# Patient Record
Sex: Male | Born: 1975 | Race: White | Hispanic: No | Marital: Married | Smoking: Current some day smoker
Health system: Southern US, Community
[De-identification: ages and names within clinical notes are randomized; demographics above are authoritative.]

## PROBLEM LIST (undated history)

## (undated) DIAGNOSIS — E739 Lactose intolerance, unspecified: Secondary | ICD-10-CM

## (undated) DIAGNOSIS — K635 Polyp of colon: Secondary | ICD-10-CM

## (undated) HISTORY — DX: Lactose intolerance, unspecified: E73.9

## (undated) HISTORY — DX: Polyp of colon: K63.5

## (undated) HISTORY — PX: THYROIDECTOMY: SHX17

## (undated) HISTORY — PX: ANTERIOR CRUCIATE LIGAMENT REPAIR: SHX115

## (undated) HISTORY — PX: COLONOSCOPY: SHX174

---

## 2011-05-30 HISTORY — PX: OTHER SURGICAL HISTORY: SHX169

## 2011-05-30 HISTORY — PX: ORIF FOREARM FRACTURE: SHX2124

## 2013-04-01 ENCOUNTER — Emergency Department (HOSPITAL_BASED_OUTPATIENT_CLINIC_OR_DEPARTMENT_OTHER)
Admission: EM | Admit: 2013-04-01 | Discharge: 2013-04-01 | Disposition: A | Discharge status: Home / Self Care | Payer: BC Managed Care – PPO | Attending: Emergency Medicine | Admitting: Emergency Medicine

## 2013-04-01 ENCOUNTER — Emergency Department (HOSPITAL_BASED_OUTPATIENT_CLINIC_OR_DEPARTMENT_OTHER): Payer: BC Managed Care – PPO

## 2013-04-01 ENCOUNTER — Encounter (HOSPITAL_BASED_OUTPATIENT_CLINIC_OR_DEPARTMENT_OTHER): Payer: Self-pay | Admitting: Emergency Medicine

## 2013-04-01 DIAGNOSIS — S93409A Sprain of unspecified ligament of unspecified ankle, initial encounter: Secondary | ICD-10-CM | POA: Insufficient documentation

## 2013-04-01 DIAGNOSIS — S63259A Unspecified dislocation of unspecified finger, initial encounter: Secondary | ICD-10-CM | POA: Insufficient documentation

## 2013-04-01 DIAGNOSIS — S93402A Sprain of unspecified ligament of left ankle, initial encounter: Secondary | ICD-10-CM

## 2013-04-01 DIAGNOSIS — Y92410 Unspecified street and highway as the place of occurrence of the external cause: Secondary | ICD-10-CM | POA: Insufficient documentation

## 2013-04-01 DIAGNOSIS — IMO0002 Reserved for concepts with insufficient information to code with codable children: Secondary | ICD-10-CM | POA: Insufficient documentation

## 2013-04-01 DIAGNOSIS — Y9389 Activity, other specified: Secondary | ICD-10-CM | POA: Insufficient documentation

## 2013-04-01 DIAGNOSIS — I10 Essential (primary) hypertension: Secondary | ICD-10-CM | POA: Insufficient documentation

## 2013-04-01 DIAGNOSIS — S80212A Abrasion, left knee, initial encounter: Secondary | ICD-10-CM

## 2013-04-01 MED ORDER — HYDROCODONE-ACETAMINOPHEN 5-325 MG PO TABS
ORAL_TABLET | ORAL | Status: AC
  Filled 2013-04-01: qty 2

## 2013-04-01 MED ORDER — HYDROCODONE-ACETAMINOPHEN 5-325 MG PO TABS: 2.0000 | ORAL_TABLET | ORAL | Status: DC | PRN

## 2013-04-01 MED ORDER — HYDROCODONE-ACETAMINOPHEN 5-325 MG PO TABS
2.0000 | ORAL_TABLET | Freq: Once | ORAL | Status: AC
  Administered 2013-04-01: 2 via ORAL

## 2013-04-01 NOTE — ED Notes (Signed)
MD at bedside. 
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
## 2013-04-04 ENCOUNTER — Ambulatory Visit (INDEPENDENT_AMBULATORY_CARE_PROVIDER_SITE_OTHER): Payer: BC Managed Care – PPO | Admitting: Family Medicine

## 2013-04-04 ENCOUNTER — Encounter: Payer: Self-pay | Admitting: Family Medicine

## 2013-04-04 VITALS — BP 124/78 | HR 78 | Ht 70.0 in | Wt 200.0 lb

## 2013-04-04 DIAGNOSIS — S63279A Dislocation of unspecified interphalangeal joint of unspecified finger, initial encounter: Secondary | ICD-10-CM

## 2013-04-04 DIAGNOSIS — S8990XA Unspecified injury of unspecified lower leg, initial encounter: Secondary | ICD-10-CM

## 2013-04-04 DIAGNOSIS — S63289A Dislocation of proximal interphalangeal joint of unspecified finger, initial encounter: Secondary | ICD-10-CM

## 2013-04-04 DIAGNOSIS — S99912A Unspecified injury of left ankle, initial encounter: Secondary | ICD-10-CM

## 2013-04-04 NOTE — Patient Instructions (Signed)
You have a Grade 3 ankle sprain. Ice the area for 15 minutes at a time, 3-4 times a day Aleve 2 tabs twice a day with food OR ibuprofen 3 tabs three times a day with food for pain and inflammation. Elevate above the level of your heart when possible Crutches if needed to help with walking Bear weight when tolerated Use cam walker or laceup ankle brace to help with stability while you recover from this injury. Come out of the boot/brace twice a day to do Up/down and alphabet exercises 2-3 sets of each. Consider physical therapy for strengthening and balance exercises in future. If not improving as expected, we may repeat x-rays or consider further testing like an MRI. Follow up with me in 2 weeks to reevaluate.  Buddy tape fingers for support for 2-4 more weeks (whenever pain has completely resolved).

## 2013-04-07 ENCOUNTER — Encounter: Payer: Self-pay | Admitting: Family Medicine

## 2013-04-07 DIAGNOSIS — S63289A Dislocation of proximal interphalangeal joint of unspecified finger, initial encounter: Secondary | ICD-10-CM | POA: Insufficient documentation

## 2013-04-07 DIAGNOSIS — S99912A Unspecified injury of left ankle, initial encounter: Secondary | ICD-10-CM | POA: Insufficient documentation
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
## 2013-04-07 NOTE — Assessment & Plan Note (Signed)
radiographs negative.  Grade 3 clinically.  ASO or cam walker for support. Crutches until he can weight bear comfortably.  Start ROM exercises.  Elevation, icing, nsaids.  F/u in 2 weeks to reevaluate.  Consider repeating radiographs, further imaging if not improving.  Consider adding formal PT if he is improving as expected.

## 2013-04-07 NOTE — Assessment & Plan Note (Signed)
Right 5th PIP dislocation - dorsal.  Reduced with excellent motion, no evidence of fracture or central slip disruption.  Buddy taping for 2-4 weeks for support until pain resolves.

## 2013-04-17 ENCOUNTER — Encounter: Payer: Self-pay | Admitting: Family Medicine

## 2013-04-17 ENCOUNTER — Ambulatory Visit (INDEPENDENT_AMBULATORY_CARE_PROVIDER_SITE_OTHER): Payer: BC Managed Care – PPO | Admitting: Family Medicine

## 2013-04-17 VITALS — BP 121/80 | HR 80 | Ht 70.0 in | Wt 200.0 lb

## 2013-04-17 DIAGNOSIS — S93409A Sprain of unspecified ligament of unspecified ankle, initial encounter: Secondary | ICD-10-CM

## 2013-04-17 DIAGNOSIS — Z5189 Encounter for other specified aftercare: Secondary | ICD-10-CM

## 2013-04-17 DIAGNOSIS — S93402A Sprain of unspecified ligament of left ankle, initial encounter: Secondary | ICD-10-CM

## 2013-04-17 DIAGNOSIS — S99912D Unspecified injury of left ankle, subsequent encounter: Secondary | ICD-10-CM

## 2013-04-17 DIAGNOSIS — S63289D Dislocation of proximal interphalangeal joint of unspecified finger, subsequent encounter: Secondary | ICD-10-CM

## 2013-04-17 NOTE — Patient Instructions (Signed)
You have a Grade 3 ankle sprain. Ice the area for 15 minutes at a time, 3-4 times a day until pain resolved. Aleve 2 tabs twice a day with food OR ibuprofen 3 tabs three times a day with food for pain and inflammation as needed at this point. Elevate above the level of your heart when possible Laceup ankle brace to help with stability while you recover from this injury for the next 4 weeks. Continue up/down and alphabet exercises. Start physical therapy for strengthening and balance exercises in future. Follow up with me in 4 weeks to reevaluate.  Buddy tape for maximum of 2 more weeks.

## 2013-04-22 ENCOUNTER — Encounter: Payer: Self-pay | Admitting: Family Medicine
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
## 2013-04-22 NOTE — Assessment & Plan Note (Signed)
Right 5th PIP dislocation - dorsal.  Tendons intact.  Buddy tape for up to 2 more weeks then focus on motion.

## 2013-04-22 NOTE — Assessment & Plan Note (Signed)
Left ankle sprain - radiographs negative.  Grade 3 clinically.  Motion has improved along with pain mildly.  Start physical therapy.  Continue home exercises.  Wear ASO if tolerated as much as possible.  NSAIDs, icing, elevation.F/u in 4 weeks.

## 2013-04-29 ENCOUNTER — Ambulatory Visit: Payer: BC Managed Care – PPO | Attending: Family Medicine | Admitting: Physical Therapy

## 2013-04-29 DIAGNOSIS — IMO0001 Reserved for inherently not codable concepts without codable children: Secondary | ICD-10-CM | POA: Insufficient documentation

## 2013-04-29 DIAGNOSIS — M25579 Pain in unspecified ankle and joints of unspecified foot: Secondary | ICD-10-CM | POA: Insufficient documentation

## 2013-04-29 DIAGNOSIS — R609 Edema, unspecified: Secondary | ICD-10-CM | POA: Insufficient documentation

## 2013-05-07 ENCOUNTER — Encounter: Payer: BC Managed Care – PPO | Admitting: Physical Therapy

## 2013-05-13 ENCOUNTER — Ambulatory Visit: Payer: BC Managed Care – PPO | Admitting: Physical Therapy

## 2013-05-15 ENCOUNTER — Ambulatory Visit (INDEPENDENT_AMBULATORY_CARE_PROVIDER_SITE_OTHER): Payer: BC Managed Care – PPO | Admitting: Family Medicine

## 2013-05-15 ENCOUNTER — Encounter: Payer: Self-pay | Admitting: Family Medicine

## 2013-05-15 ENCOUNTER — Encounter (INDEPENDENT_AMBULATORY_CARE_PROVIDER_SITE_OTHER): Payer: Self-pay

## 2013-05-15 VITALS — BP 122/79 | HR 65 | Ht 70.0 in | Wt 200.0 lb

## 2013-05-15 DIAGNOSIS — S99912D Unspecified injury of left ankle, subsequent encounter: Secondary | ICD-10-CM

## 2013-05-15 DIAGNOSIS — Z5189 Encounter for other specified aftercare: Secondary | ICD-10-CM

## 2013-05-15 DIAGNOSIS — M25511 Pain in right shoulder: Secondary | ICD-10-CM

## 2013-05-15 DIAGNOSIS — M25519 Pain in unspecified shoulder: Secondary | ICD-10-CM

## 2013-05-15 MED ORDER — TRAMADOL HCL 50 MG PO TABS: 50.0000 mg | ORAL_TABLET | Freq: Three times a day (TID) | ORAL | Status: DC | PRN

## 2013-05-15 NOTE — Patient Instructions (Signed)
Continue physical therapy and transition to a home exercise program. Follow up with me in 6 weeks or as needed.

## 2013-05-18 ENCOUNTER — Encounter: Payer: Self-pay | Admitting: Family Medicine

## 2013-05-18 NOTE — Assessment & Plan Note (Signed)
Left ankle sprain - radiographs negative.  Improving.  Continue PT and transition to HEP.  ASO with long walking, irregular surfaces.  F/u in 6 weeks or prn.
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
## 2013-05-19 NOTE — Addendum Note (Signed)
Addended by: Lenda Kelp on: 05/19/2013 08:36 AM   Modules accepted: Orders, Medications

## 2013-11-21 ENCOUNTER — Telehealth: Payer: Self-pay | Admitting: *Deleted

## 2013-11-21 DIAGNOSIS — Z0000 Encounter for general adult medical examination without abnormal findings: Secondary | ICD-10-CM

## 2013-11-21 NOTE — Telephone Encounter (Signed)
Medication List and allergies: Reviewed and updated  90 Day supply/mail order: N/A Local prescriptions:  [facility]  Immunizations Due: PNA  A/P FH, PSH, or Personal Hx: Reviewed and updated  Tdap: Within past 10 years PNA: Never  Shingles: Never   To Discuss with Provider: Not at this time

## 2013-11-24 ENCOUNTER — Encounter: Payer: Self-pay | Admitting: Internal Medicine

## 2013-11-24 ENCOUNTER — Ambulatory Visit (INDEPENDENT_AMBULATORY_CARE_PROVIDER_SITE_OTHER): Payer: BC Managed Care – PPO | Admitting: Internal Medicine

## 2013-11-24 VITALS — BP 126/76 | HR 60 | Temp 98.0°F | Ht 69.1 in | Wt 212.0 lb

## 2013-11-24 DIAGNOSIS — Z0000 Encounter for general adult medical examination without abnormal findings: Secondary | ICD-10-CM

## 2013-11-24 NOTE — Progress Notes (Signed)
Pre visit review using our clinic review tool, if applicable. No additional management support is needed unless otherwise documented below in the visit note. 

## 2013-11-24 NOTE — Assessment & Plan Note (Signed)
Td ~ 2010  Never had a cscope  Diet-exercise-STE discussed  Labs

## 2013-11-24 NOTE — Progress Notes (Signed)
   Subjective:    Patient ID: Martin Pruitt, male    DOB: December 06, 1975, 38 y.o.   MRN: [id number]  DOS:  11/24/2013 Type of  Visit: CPX   ROS Diet-- healthy most of the time  Exercise-- active, yardwork No  CP, SOB No palpitations  Denies  nausea, vomiting diarrhea, blood in the stools (-) cough, sputum production (-) wheezing, chest congestion  No dysuria, gross hematuria, difficulty urinating  No anxiety, depression    No past medical history on file.  Past Surgical History  Procedure Laterality Date  . Thyroidectomy      partial  . Anterior cruciate ligament repair      bilaterally   . Arm fracture  2013    plate (LEFT)    History   Social History  . Marital Status: Married    Spouse Name: N/A    Number of Children: 3  . Years of Education: N/A   Occupational History  . 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 Smoking status: Never Smoker   . Smokeless tobacco: Never Used     Comment: occ cigarret  . Alcohol Use: Yes     Comment: rare   . Drug Use: No  . Sexual Activity: Not on file   Other Topics Concern  . Not on file   Social History Narrative   Remarried, 2 kids from second wife     Family History  Problem Relation Age of Onset  . Hypertension Mother   . Sudden death Neg Hx   . Hyperlipidemia Neg Hx   . Heart attack Neg Hx   . Diabetes Neg Hx   . Colon cancer Neg Hx   . Prostate cancer Neg Hx        Medication List       This list is accurate as of: 11/24/13 11:59 PM.  Always use your most recent med list.               multivitamin tablet  Take 1 tablet by mouth daily.           Objective:   Physical Exam BP 126/76  Pulse 60  Temp(Src) 98 F (36.7 C)  Ht 5' 9.1" (1.755 m)  Wt 212 lb (96.163 kg)  BMI 31.22 kg/m2  SpO2 99% General -- alert, well-developed, NAD.  Neck --no thyromegaly  HEENT-- Not pale. Lungs -- normal respiratory effort, no intercostal retractions, no accessory muscle use, and normal breath  sounds.  Heart-- normal rate, regular rhythm, no murmur.  Abdomen-- Not distended, good bowel sounds,soft, non-tender.  Extremities-- no pretibial edema bilaterally  Neurologic--  alert & oriented X3. Speech normal, gait appropriate for age, strength symmetric and appropriate for age.  Psych-- Cognition and judgment appear intact. Cooperative with normal attention span and concentration. No anxious or depressed appearing.      Assessment & Plan:

## 2013-11-24 NOTE — Patient Instructions (Signed)
Please come back fasting: CMP CBC TSH FLP-- dx v70  Next visit one year, fasting  Testicular Self-Exam A self-examination of your testicles involves looking at and feeling your testicles for abnormal lumps or swelling. Several things can cause swelling, lumps, or pain in your testicles. Some of these causes are:  Injuries.  Inflammation.  Infection.  Accumulation of fluids around your testicle (hydrocele).  Twisted testicles (testicular torsion).  Testicular cancer. Self-examination of the testicles and groin areas may be advised if you are at risk for testicular cancer. Risks for testicular cancer include:  An undescended testicle (cryptorchidism).  A history of previous testicular cancer.  A family history of testicular cancer. The testicles are easiest to examine after warm baths or showers and are more difficult to examine when you are cold. This is because the muscles attached to the testicles retract and pull them up higher or into the abdomen. Follow these steps while you are standing:  Hold your penis away from your body.  Roll one testicle between your thumb and forefinger, feeling the entire testicle.  Roll the other testicle between your thumb and forefinger, feeling the entire testicle. Feel for lumps, swelling, or discomfort. A normal testicle is egg shaped and feels firm. It is smooth and not tender. The spermatic cord can be felt as a firm spaghetti-like cord at the back of your testicle. It is also important to examine the crease between the front of your leg and your abdomen. Feel for any bumps that are tender. These could be enlarged lymph nodes.  Document Released: 08/21/2000 Document Revised: 01/15/2013 Document Reviewed: 11/04/2012 [facility] Patient Information 2015 [place], [place]. This information is not intended to replace advice given to you by your health care Henlee Donovan. Make sure you discuss any questions you have with your health care Harika Laidlaw.

## 2013-11-26 ENCOUNTER — Other Ambulatory Visit (INDEPENDENT_AMBULATORY_CARE_PROVIDER_SITE_OTHER): Payer: BC Managed Care – PPO

## 2013-11-26 DIAGNOSIS — Z0000 Encounter for general adult medical examination without abnormal findings: Secondary | ICD-10-CM

## 2013-11-26 LAB — COMPREHENSIVE METABOLIC PANEL
ALK PHOS: 49 U/L (ref 39–117)
ALT: 21 U/L (ref 0–53)
AST: 22 U/L (ref 0–37)
Albumin: 4.2 g/dL (ref 3.5–5.2)
BILIRUBIN TOTAL: 0.4 mg/dL (ref 0.2–1.2)
BUN: 20 mg/dL (ref 6–23)
CO2: 26 mEq/L (ref 19–32)
Calcium: 9.3 mg/dL (ref 8.4–10.5)
Chloride: 107 mEq/L (ref 96–112)
Creatinine, Ser: 1.5 mg/dL (ref 0.4–1.5)
GFR: 56.51 mL/min — ABNORMAL LOW (ref >60.00)
Glucose, Bld: 87 mg/dL (ref 70–99)
POTASSIUM: 4.2 meq/L (ref 3.5–5.1)
Sodium: 140 mEq/L (ref 135–145)
TOTAL PROTEIN: 6.8 g/dL (ref 6.0–8.3)

## 2013-11-26 LAB — CBC
HEMATOCRIT: 43.9 % (ref 39.0–52.0)
HEMOGLOBIN: 14.8 g/dL (ref 13.0–17.0)
MCHC: 33.6 g/dL (ref 30.0–36.0)
MCV: 86.7 fl (ref 78.0–100.0)
Platelets: 190 10*3/uL (ref 150.0–400.0)
RBC: 5.06 Mil/uL (ref 4.22–5.81)
RDW: 13 % (ref 11.5–15.5)
WBC: 7.1 10*3/uL (ref 4.0–10.5)

## 2013-11-26 LAB — LIPID PANEL
CHOL/HDL RATIO: 4
Cholesterol: 144 mg/dL (ref 0–200)
HDL: 35.5 mg/dL — AB (ref >39.00)
LDL CALC: 57 mg/dL (ref 0–99)
NONHDL: 108.5
Triglycerides: 260 mg/dL — ABNORMAL HIGH (ref 0.0–149.0)
VLDL: 52 mg/dL — AB (ref 0.0–40.0)

## 2013-11-26 LAB — TSH: TSH: 2.32 u[IU]/mL (ref 0.35–4.50)

## 2013-11-26 NOTE — Addendum Note (Signed)
Addended by: Peggyann Shoals on: 11/26/2013 09:01 AM   Modules accepted: Orders

## 2014-10-13 IMAGING — CR DG HAND COMPLETE 3+V*R*
3 series · 3 of 3 positions shown · non-contrast
Comparison: None.

CLINICAL DATA: Pain post trauma

EXAM:
RIGHT HAND - COMPLETE 3+ VIEW

[x hand pa right]
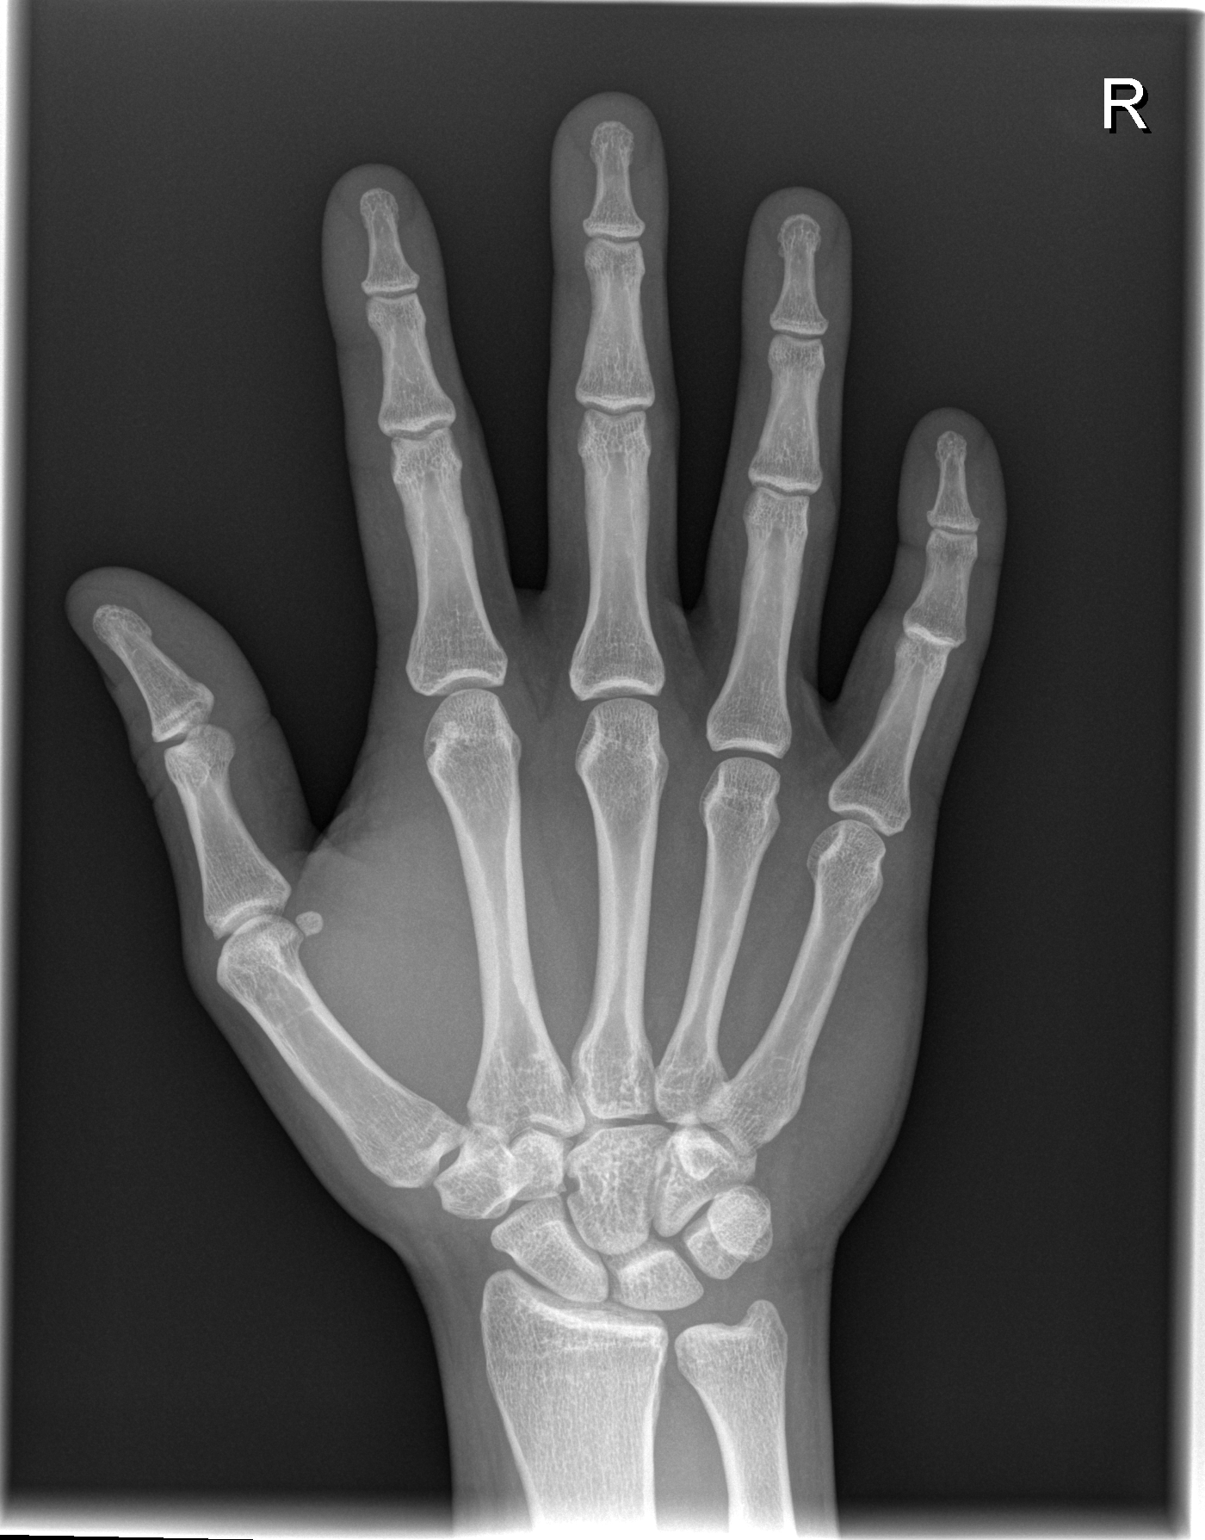

[x hand oblique right]
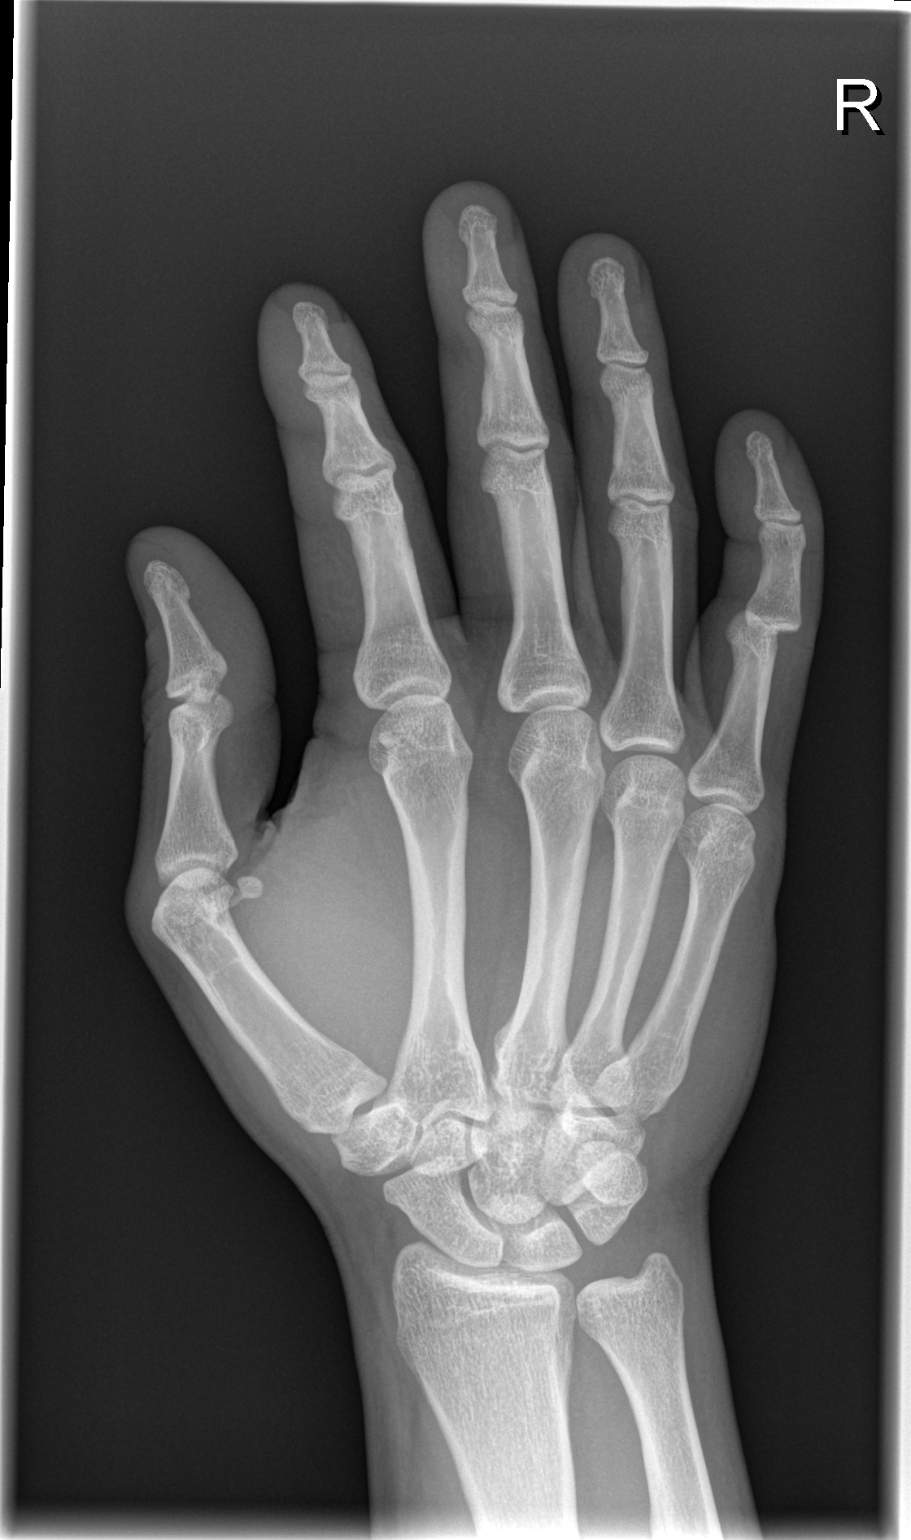

[x hand lat right]
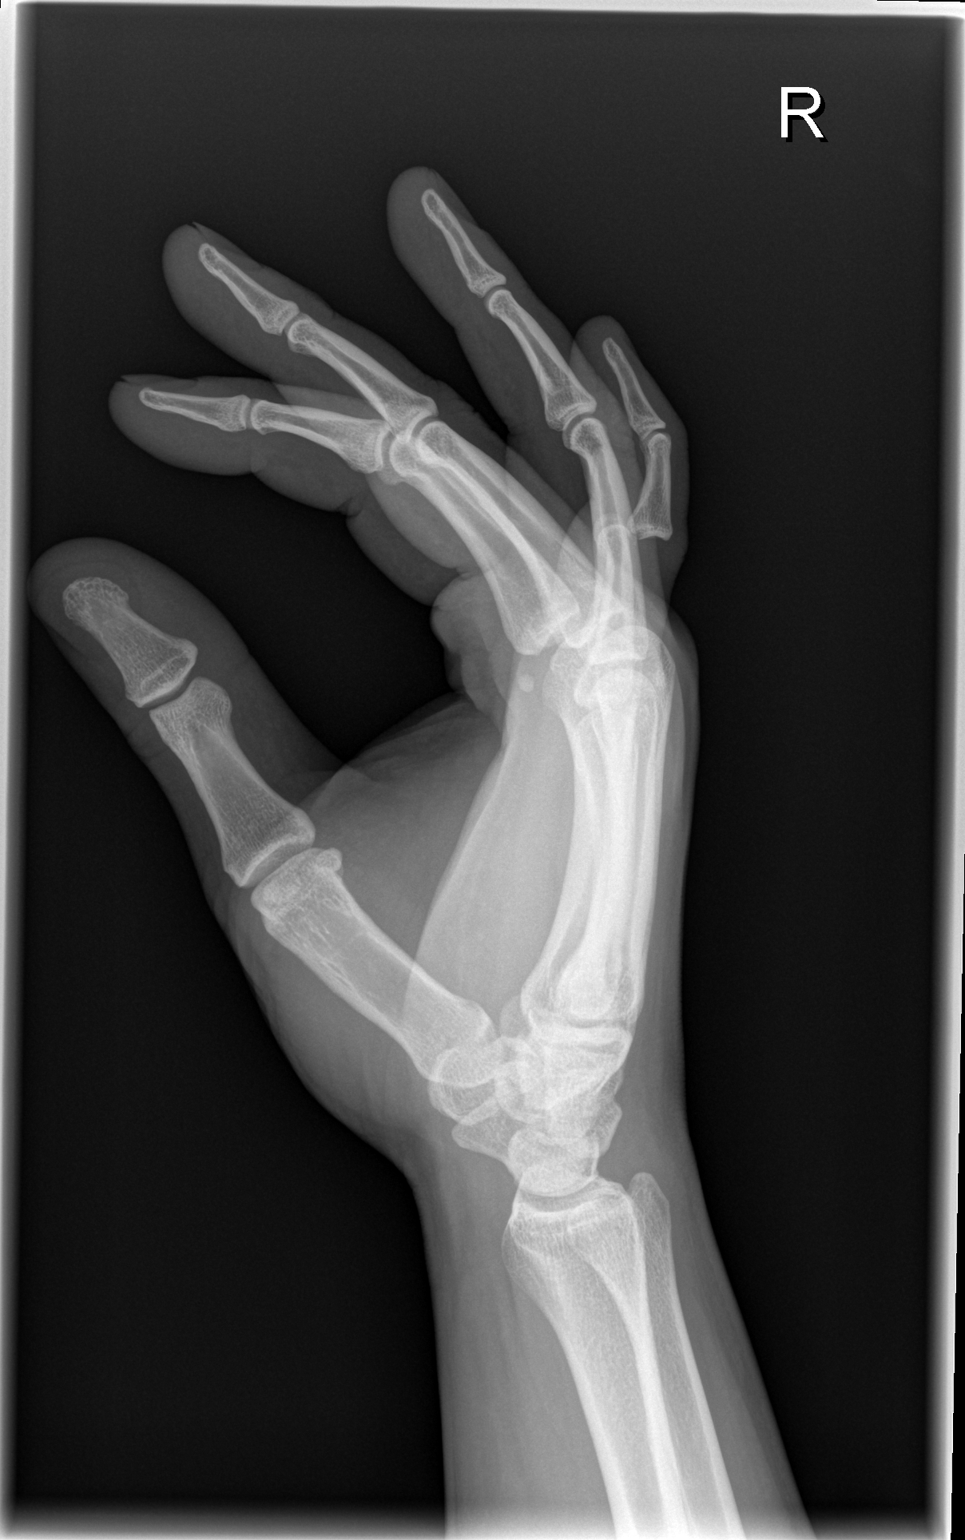

[3 of 3 positions shown; findings below may reference images not displayed]

FINDINGS: Frontal, oblique, and lateral views were obtained. There is
dislocation at the 5th PIP joint with the middle and distal
phalanges displaced dorsal to the 5th proximal phalanx. No fracture.
No other dislocations. No fractures elsewhere. Joint spaces appear
intact except for the area of dislocation at the 5th PIP joint.
IMPRESSION: Dislocation at the 5th PIP joint. No fracture. No other abnormality
noted.

## 2015-02-10 ENCOUNTER — Ambulatory Visit (HOSPITAL_BASED_OUTPATIENT_CLINIC_OR_DEPARTMENT_OTHER)
Admission: RE | Admit: 2015-02-10 | Discharge: 2015-02-10 | Disposition: A | Discharge status: Home / Self Care | Payer: BLUE CROSS/BLUE SHIELD | Source: Ambulatory Visit | Attending: Internal Medicine | Admitting: Internal Medicine

## 2015-02-10 ENCOUNTER — Ambulatory Visit (INDEPENDENT_AMBULATORY_CARE_PROVIDER_SITE_OTHER): Payer: BLUE CROSS/BLUE SHIELD | Admitting: Internal Medicine

## 2015-02-10 ENCOUNTER — Encounter: Payer: Self-pay | Admitting: Internal Medicine

## 2015-02-10 VITALS — BP 118/82 | HR 80 | Temp 98.2°F | Ht 69.0 in | Wt 208.1 lb

## 2015-02-10 DIAGNOSIS — Z0000 Encounter for general adult medical examination without abnormal findings: Secondary | ICD-10-CM | POA: Diagnosis not present

## 2015-02-10 DIAGNOSIS — Z23 Encounter for immunization: Secondary | ICD-10-CM | POA: Diagnosis not present

## 2015-02-10 DIAGNOSIS — S99912A Unspecified injury of left ankle, initial encounter: Secondary | ICD-10-CM

## 2015-02-10 DIAGNOSIS — S92132A Displaced fracture of posterior process of left talus, initial encounter for closed fracture: Secondary | ICD-10-CM | POA: Diagnosis not present

## 2015-02-10 DIAGNOSIS — M25572 Pain in left ankle and joints of left foot: Secondary | ICD-10-CM | POA: Diagnosis present

## 2015-02-10 DIAGNOSIS — Z114 Encounter for screening for human immunodeficiency virus [HIV]: Secondary | ICD-10-CM

## 2015-02-10 MED ORDER — HYDROCODONE-ACETAMINOPHEN 5-325 MG PO TABS: 1.0000 | ORAL_TABLET | Freq: Three times a day (TID) | ORAL | Status: DC | PRN

## 2015-02-10 NOTE — Progress Notes (Signed)
Pre visit review using our clinic review tool, if applicable. No additional management support is needed unless otherwise documented below in the visit note. 

## 2015-02-10 NOTE — Assessment & Plan Note (Addendum)
Td 01-2015, declined flu shot CCS: Never had a cscope  Prostate cancer screening: Not indicated Diet-exercise---- discussed  Labs   Other issues: Left knee pain, to see ortho this week Allergic to milk? Recommend avoidance for now.

## 2015-02-10 NOTE — Patient Instructions (Signed)
Get your blood work before you leave     Next visit  for a physical exam in one year Please schedule an appointment at the front desk Please come back fasting  
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
## 2015-02-11 ENCOUNTER — Telehealth: Payer: Self-pay | Admitting: Internal Medicine

## 2015-02-11 LAB — LIPID PANEL
Cholesterol: 149 mg/dL (ref 0–200)
HDL: 45.8 mg/dL (ref >39.00)
LDL Cholesterol: 71 mg/dL (ref 0–99)
NONHDL: 103.36
Total CHOL/HDL Ratio: 3
Triglycerides: 161 mg/dL — ABNORMAL HIGH (ref 0.0–149.0)
VLDL: 32.2 mg/dL (ref 0.0–40.0)

## 2015-02-11 LAB — BASIC METABOLIC PANEL
BUN: 17 mg/dL (ref 6–23)
CALCIUM: 9.5 mg/dL (ref 8.4–10.5)
CO2: 28 mEq/L (ref 19–32)
Chloride: 102 mEq/L (ref 96–112)
Creatinine, Ser: 1.33 mg/dL (ref 0.40–1.50)
GFR: 63.52 mL/min (ref >60.00)
GLUCOSE: 90 mg/dL (ref 70–99)
Potassium: 4.1 mEq/L (ref 3.5–5.1)
SODIUM: 140 meq/L (ref 135–145)

## 2015-02-11 LAB — HIV ANTIBODY (ROUTINE TESTING W REFLEX): HIV: NONREACTIVE

## 2015-02-11 LAB — TSH: TSH: 1.9 u[IU]/mL (ref 0.35–4.50)

## 2015-02-11 NOTE — Telephone Encounter (Signed)
Relation to BV:PLWU Call back number: [phone]   Reason for call:  Patient returning your call. Patient was last seen 02/10/15 and had a x ray done as well.

## 2015-02-11 NOTE — Telephone Encounter (Signed)
LMOM informing Pt of x-ray results. Informed him to call his orthopedic doctor and inform them that he needs a soft cast boot, informed him that his MD should be able to see x-rays in the computer. Instructed him to limit weight and movement until boot in received. Instructed him to call if he has any other questions or concerns.

## 2016-02-11 ENCOUNTER — Encounter: Payer: Self-pay | Admitting: Internal Medicine

## 2016-02-11 ENCOUNTER — Ambulatory Visit (INDEPENDENT_AMBULATORY_CARE_PROVIDER_SITE_OTHER): Payer: BLUE CROSS/BLUE SHIELD | Admitting: Internal Medicine

## 2016-02-11 DIAGNOSIS — Z0000 Encounter for general adult medical examination without abnormal findings: Secondary | ICD-10-CM | POA: Diagnosis not present

## 2016-02-11 LAB — LIPID PANEL
Cholesterol: 141 mg/dL (ref 125–200)
HDL: 43 mg/dL (ref >=40)
LDL CALC: 71 mg/dL (ref <130)
TRIGLYCERIDES: 136 mg/dL (ref <150)
Total CHOL/HDL Ratio: 3.3 Ratio (ref <=5.0)
VLDL: 27 mg/dL (ref <30)

## 2016-02-11 LAB — CBC WITH DIFFERENTIAL/PLATELET
BASOS ABS: 0 {cells}/uL (ref 0–200)
Basophils Relative: 0 %
EOS ABS: 148 {cells}/uL (ref 15–500)
Eosinophils Relative: 2 %
HEMATOCRIT: 44.6 % (ref 38.5–50.0)
HEMOGLOBIN: 15.2 g/dL (ref 13.2–17.1)
Lymphocytes Relative: 26 %
Lymphs Abs: 1924 cells/uL (ref 850–3900)
MCH: 29.3 pg (ref 27.0–33.0)
MCHC: 34.1 g/dL (ref 32.0–36.0)
MCV: 85.9 fL (ref 80.0–100.0)
MONOS PCT: 7 %
MPV: 10.7 fL (ref 7.5–12.5)
Monocytes Absolute: 518 cells/uL (ref 200–950)
Neutro Abs: 4810 cells/uL (ref 1500–7800)
Neutrophils Relative %: 65 %
PLATELETS: 188 10*3/uL (ref 140–400)
RBC: 5.19 MIL/uL (ref 4.20–5.80)
RDW: 13.4 % (ref 11.0–15.0)
WBC: 7.4 10*3/uL (ref 3.8–10.8)

## 2016-02-11 LAB — TSH: TSH: 1.66 mIU/L (ref 0.40–4.50)

## 2016-02-11 NOTE — Progress Notes (Signed)
Pre visit review using our clinic review tool, if applicable. No additional management support is needed unless otherwise documented below in the visit note. 

## 2016-02-11 NOTE — Assessment & Plan Note (Addendum)
Td 01-2015, declined flu shot CCS: Never had a cscope  Prostate cancer screening: Not indicated Diet-exercise----doing great, has lost weight. Labs reviewed, today will check a CBC, FLP and TSH. He is a very light smoker, encouraged to quit if possible, also to see the dentist regularly.

## 2016-02-11 NOTE — Patient Instructions (Signed)
GO TO THE LAB : Get the blood work     GO TO THE FRONT DESK Schedule your next appointment for a  Physical exam in 1 or 2 years

## 2016-02-11 NOTE — Progress Notes (Signed)
Subjective:    Patient ID: Martin Pruitt, male    DOB: [birth year], 40 y.o.   MRN: [id number]  DOS:  02/11/2016 Type of visit - description : CPX Interval history: No major concerns, has lost weight by  Increasing his  physical activity and trying to eat healthy  Wt Readings from Last 3 Encounters:  02/11/16 196 lb 2 oz (89 kg)  02/10/15 208 lb 2 oz (94.4 kg)  11/24/13 212 lb (96.2 kg)     Review of Systems  Constitutional: No fever. No chills. No unexplained wt changes. No unusual sweats  HEENT: No dental problems, no ear discharge, no facial swelling, no voice changes. No eye discharge, no eye  redness , no  intolerance to light   Respiratory: No wheezing , no  difficulty breathing. No cough , no mucus production  Cardiovascular: No CP, no leg swelling , no  Palpitations  GI: no nausea, no vomiting, no diarrhea , no  abdominal pain.  No blood in the stools. No dysphagia, no odynophagia    Endocrine: No polyphagia, no polyuria , no polydipsia  GU: No dysuria, gross hematuria, difficulty urinating. No urinary urgency, no frequency.  Musculoskeletal: Occasionally has aches and pains, he has a very active  Skin: No change in the color of the skin, palor , no  Rash  Allergic, immunologic: No environmental allergies , no  food allergies  Neurological: No dizziness no  syncope. No headaches. No diplopia, no slurred, no slurred speech, no motor deficits, no facial  Numbness  Hematological: No enlarged lymph nodes, no easy bruising , no unusual bleedings  Psychiatry: No suicidal ideas, no hallucinations, no beavior problems, no confusion.  No unusual/severe anxiety, no depression  History reviewed. No pertinent past medical history.  Past Surgical History:  Procedure Laterality Date  . ANTERIOR CRUCIATE LIGAMENT REPAIR     bilaterally   . arm fracture  2013   plate (LEFT)  . THYROIDECTOMY     partial    Social History   Social History  . Marital status: Married      Spouse name: N/A  . Number of children: 3  . 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 Smoking status: Light Tobacco Smoker  . Smokeless tobacco: Never Used     Comment: occ cigarret  . Alcohol use 0.0 oz/week     Comment: rare   . Drug use: No  . Sexual activity: Not on file   Other Topics Concern  . Not on file   Social History Narrative   Remarried, 2 kids with  second wife      Family History  Problem Relation Age of Onset  . Hypertension Mother   . Sudden death Neg Hx   . Hyperlipidemia Neg Hx   . Heart attack Neg Hx   . Diabetes Neg Hx   . Colon cancer Neg Hx   . Prostate cancer Neg Hx        Medication List       Accurate as of 02/11/16  3:31 PM. Always use your most recent med list.          FISH OIL PO Take 1 tablet by mouth daily.   HYDROcodone-acetaminophen 5-325 MG tablet Commonly known as:  NORCO/VICODIN Take 1-2 tablets by mouth every 8 (eight) hours as needed.   multivitamin tablet Take 1 tablet by mouth daily.   VITAMIN C PO Take  1 tablet by mouth daily.   Vitamin-B Complex Tabs Take 1 tablet by mouth daily.          Objective:   Physical Exam BP 108/78 (BP Location: Left Arm, Patient Position: Sitting, Cuff Size: Normal)   Pulse 79   Temp 98.1 F (36.7 C) (Oral)   Resp 14   Ht 5\' 9"  (1.753 m)   Wt 196 lb 2 oz (89 kg)   SpO2 98%   BMI 28.96 kg/m   General:   Well developed, well nourished . NAD.  Neck: No  thyromegaly  HEENT:  Normocephalic . Face symmetric, atraumatic Lungs:  CTA B Normal respiratory effort, no intercostal retractions, no accessory muscle use. Heart: RRR,  no murmur.  No pretibial edema bilaterally  Abdomen:  Not distended, soft, non-tender. No rebound or rigidity.   Skin: Exposed areas without rash. Not pale. Not jaundice Neurologic:  alert & oriented X3.  Speech normal, gait appropriate for age and unassisted Strength symmetric  and appropriate for age.  Psych: Cognition and judgment appear intact.  Cooperative with normal attention span and concentration.  Behavior appropriate. No anxious or depressed appearing.    Assessment & Plan:   Assessment Partial thyroidectomy  PLAN Here for a CPX, doing well. RTC one or 2 years

## 2016-02-13 DIAGNOSIS — Z09 Encounter for follow-up examination after completed treatment for conditions other than malignant neoplasm: Secondary | ICD-10-CM | POA: Insufficient documentation

## 2016-02-13 NOTE — Assessment & Plan Note (Signed)
Here for a CPX, doing well. RTC one or 2 years
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
## 2016-12-20 DIAGNOSIS — L700 Acne vulgaris: Secondary | ICD-10-CM | POA: Diagnosis not present

## 2017-02-16 ENCOUNTER — Encounter: Payer: Self-pay | Admitting: Internal Medicine

## 2017-02-16 ENCOUNTER — Ambulatory Visit (INDEPENDENT_AMBULATORY_CARE_PROVIDER_SITE_OTHER): Payer: BLUE CROSS/BLUE SHIELD | Admitting: Internal Medicine

## 2017-02-16 VITALS — BP 108/68 | HR 67 | Temp 97.8°F | Resp 14 | Ht 69.0 in | Wt 202.4 lb

## 2017-02-16 DIAGNOSIS — Z09 Encounter for follow-up examination after completed treatment for conditions other than malignant neoplasm: Secondary | ICD-10-CM | POA: Diagnosis not present

## 2017-02-16 DIAGNOSIS — Z0000 Encounter for general adult medical examination without abnormal findings: Secondary | ICD-10-CM

## 2017-02-16 LAB — LIPID PANEL
CHOL/HDL RATIO: 3.5 (calc) (ref <5.0)
Cholesterol: 151 mg/dL (ref <200)
HDL: 43 mg/dL (ref >40)
LDL Cholesterol (Calc): 80 mg/dL (calc)
NON-HDL CHOLESTEROL (CALC): 108 mg/dL (ref <130)
Triglycerides: 190 mg/dL — ABNORMAL HIGH (ref <150)

## 2017-02-16 LAB — COMPREHENSIVE METABOLIC PANEL
AG Ratio: 2.2 (calc) (ref 1.0–2.5)
ALT: 21 U/L (ref 9–46)
AST: 25 U/L (ref 10–40)
Albumin: 4.7 g/dL (ref 3.6–5.1)
Alkaline phosphatase (APISO): 61 U/L (ref 40–115)
BUN / CREAT RATIO: 13 (calc) (ref 6–22)
BUN: 19 mg/dL (ref 7–25)
CO2: 25 mmol/L (ref 20–32)
CREATININE: 1.41 mg/dL — AB (ref 0.60–1.35)
Calcium: 9 mg/dL (ref 8.6–10.3)
Chloride: 101 mmol/L (ref 98–110)
GLUCOSE: 93 mg/dL (ref 65–99)
Globulin: 2.1 g/dL (calc) (ref 1.9–3.7)
POTASSIUM: 4 mmol/L (ref 3.5–5.3)
Sodium: 137 mmol/L (ref 135–146)
Total Bilirubin: 0.5 mg/dL (ref 0.2–1.2)
Total Protein: 6.8 g/dL (ref 6.1–8.1)

## 2017-02-16 LAB — TSH: TSH: 2.72 mIU/L (ref 0.40–4.50)

## 2017-02-16 MED ORDER — MUPIROCIN CALCIUM 2 % EX CREA: 1.0000 "application " | TOPICAL_CREAM | Freq: Two times a day (BID) | CUTANEOUS | 0 refills | Status: DC

## 2017-02-16 NOTE — Patient Instructions (Signed)
GO TO THE LAB : Get the blood work     GO TO THE FRONT DESK Schedule your next appointment for a  physical exam in one year  Apply the cream to the forehead twice a day for 2 weeks; call  if the area is not back to normal after the treatment.

## 2017-02-16 NOTE — Progress Notes (Signed)
Pre visit review using our clinic review tool, if applicable. No additional management support is needed unless otherwise documented below in the visit note. 

## 2017-02-16 NOTE — Assessment & Plan Note (Signed)
-  Td 01-2015, declined flu shot -CCS: Never had a cscope  -Prostate cancer screening: Not indicated -Diet-exercise: discussed  -Labs reviewed, today will check a  CMP, FLP, TSH He is a very light smoker,counseled

## 2017-02-16 NOTE — Progress Notes (Signed)
Subjective:    Patient ID: Martin Pruitt, male    DOB: [birth year], 41 y.o.   MRN: [id number]  DOS:  02/16/2017 Type of visit - description : cpx Interval history: In general feeling well, he remains active, plays volleyball, trying to eat healthy.  Wt Readings from Last 3 Encounters:  02/16/17 202 lb 6 oz (91.8 kg)  02/11/16 196 lb 2 oz (89 kg)  02/10/15 208 lb 2 oz (94.4 kg)      Review of Systems Still has left knee pain on and off, sees orthopedic doctor, last visit approximately 6 months ago, he got a local injection. Despite the pain he is able to do all his ADLs and remains active. Had a "pimple" on the forehead 4 weeks ago, has not healed just yet. Prior to the pimple, the skin there was normal.  Other than above, a 14 point review of systems is negative       No past medical history on file.  Past Surgical History:  Procedure Laterality Date  . ANTERIOR CRUCIATE LIGAMENT REPAIR     bilaterally   . arm fracture  2013   plate (LEFT)  . THYROIDECTOMY     partial    Social History   Social History  . Marital status: Married    Spouse name: N/A  . Number of children: 3  . 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 Smoking status: Light Tobacco Smoker    Types: Cigarettes  . Smokeless tobacco: Never Used     Comment: 2  pack a month  . Alcohol use 0.0 oz/week     Comment: rare   . Drug use: No  . Sexual activity: Not on file   Other Topics Concern  . Not on file   Social History Narrative   Remarried, 2 kids with  second wife      Family History  Problem Relation Age of Onset  . Hypertension Mother   . Sudden death Neg Hx   . Hyperlipidemia Neg Hx   . Heart attack Neg Hx   . Diabetes Neg Hx   . Colon cancer Neg Hx   . Prostate cancer Neg Hx      Allergies as of 02/16/2017      Reactions   Dairy Aid [lactase] Other (See Comments)   Lactose Intolerant      Medication List         Accurate as of 02/16/17 11:59 PM. Always use your most recent med list.          FISH OIL PO Take 1 tablet by mouth daily.   mupirocin cream 2 % Commonly known as:  BACTROBAN Apply 1 application topically 2 (two) times daily.   NIACIN ER PO Take by mouth.   VITAMIN C PO Take 1 tablet by mouth daily.   VITAMIN D PO Take by mouth.   Vitamin-B Complex Tabs Take 1 tablet by mouth daily.            Discharge Care Instructions        Start     Ordered   02/16/17 0000  mupirocin cream (BACTROBAN) 2 %  2 times daily     02/16/17 1609   02/16/17 0000  Comp Met (CMET)     02/16/17 1612   02/16/17 0000  Lipid panel     02/16/17 1612   02/16/17 0000  TSH  02/16/17 1612         Objective:   Physical Exam BP 108/68 (BP Location: Left Arm, Patient Position: Sitting, Cuff Size: Normal)   Pulse 67   Temp 97.8 F (36.6 C) (Oral)   Resp 14   Ht '5\' 9"'$  (1.753 m)   Wt 202 lb 6 oz (91.8 kg)   SpO2 97%   BMI 29.89 kg/m   General:   Well developed, well nourished . NAD.  Neck: No  thyromegaly  HEENT:  Normocephalic . Face symmetric, atraumatic Lungs:  CTA B Normal respiratory effort, no intercostal retractions, no accessory muscle use. Heart: RRR,  no murmur.  No pretibial edema bilaterally  Abdomen:  Not distended, soft, non-tender. No rebound or rigidity.   Skin:  At the center of the forehead he has a skin colored elevation, 32 mm, has a small excoriation on top Neurologic:  alert & oriented X3.  Speech normal, gait appropriate for age and unassisted Strength symmetric and appropriate for age.  Psych: Cognition and judgment appear intact.  Cooperative with normal attention span and concentration.  Behavior appropriate. No anxious or depressed appearing.    Assessment & Plan:   Assessment Partial thyroidectomy  PLAN Here for a CPX, doing well. Chronic left knee pain: He sees ortho regularly, I don't have much to offer him except ice,   ibuprofen, Tylenol. He is however very active and has no limitations. Recommend observation and consult orthopedic doctor as needed Skin lesion: At the forehead, likely minor infection/inflammation. Will prescribe mupirocin twice a day for 2 weeks, call if the area is not back to normal RTC one year CPX

## 2017-02-18 NOTE — Assessment & Plan Note (Signed)
Here for a CPX, doing well. Chronic left knee pain: He sees ortho regularly, I don't have much to offer him except ice,  ibuprofen, Tylenol. He is however very active and has no limitations. Recommend observation and consult orthopedic doctor as needed Skin lesion: At the forehead, likely minor infection/inflammation. Will prescribe mupirocin twice a day for 2 weeks, call if the area is not back to normal RTC one year CPX

## 2017-03-15 DIAGNOSIS — K8010 Calculus of gallbladder with chronic cholecystitis without obstruction: Secondary | ICD-10-CM | POA: Diagnosis not present

## 2017-05-31 ENCOUNTER — Encounter: Payer: Self-pay | Admitting: Allergy and Immunology

## 2017-05-31 ENCOUNTER — Ambulatory Visit: Payer: BLUE CROSS/BLUE SHIELD | Admitting: Allergy and Immunology

## 2017-05-31 VITALS — BP 128/78 | HR 68 | Temp 98.1°F | Resp 16 | Ht 68.5 in | Wt 206.6 lb

## 2017-05-31 DIAGNOSIS — T7840XD Allergy, unspecified, subsequent encounter: Secondary | ICD-10-CM

## 2017-05-31 DIAGNOSIS — R198 Other specified symptoms and signs involving the digestive system and abdomen: Secondary | ICD-10-CM | POA: Insufficient documentation

## 2017-05-31 DIAGNOSIS — Z91018 Allergy to other foods: Secondary | ICD-10-CM

## 2017-05-31 DIAGNOSIS — E739 Lactose intolerance, unspecified: Secondary | ICD-10-CM | POA: Insufficient documentation

## 2017-05-31 DIAGNOSIS — T7840XA Allergy, unspecified, initial encounter: Secondary | ICD-10-CM | POA: Insufficient documentation

## 2017-05-31 NOTE — Assessment & Plan Note (Signed)
Gastrointestinal symptoms, uncertain etiology. Skin tests to select food allergens were negative today with the exception of borderline positive/equivocal to casein. The negative predictive value of food allergen skin testing is excellent (approximately 95%).  To be thorough, we will evaluate further with labs.  Negative skin tests and serum specific IgE levels tests do not rule out food intolerances or cell-mediated enteropathies which may lend to GI symptoms. These etiologies are suggested when elimination of the responsible food leads to symptom resolution and re-introduction of the food is followed by the return of symptoms.   A laboratory order has been provided for serum specific IgE against cow's milk, cow's milk components, and casein.  Until IgE mediated allergy has been definitively ruled out, continue avoidance of cow's milk.  The patient has been encouraged to keep a careful symptom/food journal and eliminate any food suspected of correlating with symptoms.   If GI symptoms persist or progress, gastroenterologist evaluation may be warranted.

## 2017-05-31 NOTE — Progress Notes (Signed)
New Patient Note  RE: Martin EHLE MRN: [id number] DOB: Sep 23, 1975 Date of Office Visit: 05/31/2017  Referring provider: Colon Branch, MD Primary care provider: Colon Branch, MD  Chief Complaint: Food Intolerance and Other (GI symptoms)   History of present illness: Martin Pruitt is a 42 y.o. male seen today in consultation requested by Kathlene November, MD.  He reports that when he was 42 years old he began to experience GI symptoms after consuming cows milk.  The GI symptoms included belching, bloating, flatulence, and diarrhea.  He did not experience concomitant cardiopulmonary symptoms.  He believes that drinking cows milk may have contributed to acne outbreaks as well.  Therefore, he eliminated milk from his diet and his symptoms improved.  He is uncertain but believes that he has tried Lactaid problems with similar GI symptoms.  Recently, he has been experiencing abdominal discomfort, bloating, flatulence, and diarrhea after meals, however he is uncertain which food may be triggering the symptoms.  In addition, he believes that he has experienced increased GI symptoms after coffee and cigarettes. He does not have a history of symptoms consistent with allergic rhinitis, asthma, or atopic dermatitis.   Assessment and plan: History of food allergy Gastrointestinal symptoms, uncertain etiology. Skin tests to select food allergens were negative today with the exception of borderline positive/equivocal to casein. The negative predictive value of food allergen skin testing is excellent (approximately 95%).  To be thorough, we will evaluate further with labs.  Negative skin tests and serum specific IgE levels tests do not rule out food intolerances or cell-mediated enteropathies which may lend to GI symptoms. These etiologies are suggested when elimination of the responsible food leads to symptom resolution and re-introduction of the food is followed by the return of symptoms.   A laboratory order has  been provided for serum specific IgE against cow's milk, cow's milk components, and casein.  Until IgE mediated allergy has been definitively ruled out, continue avoidance of cow's milk.  The patient has been encouraged to keep a careful symptom/food journal and eliminate any food suspected of correlating with symptoms.   If GI symptoms persist or progress, gastroenterologist evaluation may be warranted.   Diagnostics: Food allergen skin testing: Borderline positive/equivocal to casein.    Physical examination: Blood pressure 128/78, pulse 68, temperature 98.1 F (36.7 C), temperature source Oral, resp. rate 16, height 5' 8.5" (1.74 m), weight 206 lb 9.6 oz (93.7 kg), SpO2 97 %.  General: Alert, interactive, in no acute distress. HEENT: TMs pearly gray, turbinates mildly edematous without discharge, post-pharynx unremarkable. Neck: Supple without lymphadenopathy. Lungs: Clear to auscultation without wheezing, rhonchi or rales. CV: Normal S1, S2 without murmurs. Abdomen: Nondistended, nontender. Skin: Warm and dry, without lesions or rashes. Extremities:  No clubbing, cyanosis or edema. Neuro:   Grossly intact.  Review of systems:  Review of systems negative except as noted in HPI / PMHx or noted below: Review of Systems  Constitutional: Negative.   HENT: Negative.   Eyes: Negative.   Respiratory: Negative.   Cardiovascular: Negative.   Gastrointestinal: Negative.   Genitourinary: Negative.   Musculoskeletal: Negative.   Skin: Negative.   Neurological: Negative.   Endo/Heme/Allergies: Negative.   Psychiatric/Behavioral: Negative.     Past medical history:  Past Medical History:  Diagnosis Date  . Lactose intolerance     Past surgical history:  Past Surgical History:  Procedure Laterality Date  . ANTERIOR CRUCIATE LIGAMENT REPAIR     bilaterally   .  arm fracture  2013   plate (LEFT)  . THYROIDECTOMY     partial    Family history: Family History  Problem  Relation Age of Onset  . Hypertension Mother   . Sudden death Neg Hx   . Hyperlipidemia Neg Hx   . Heart attack Neg Hx   . Diabetes Neg Hx   . Colon cancer Neg Hx   . Prostate cancer Neg Hx   . Allergic rhinitis Neg Hx   . Angioedema Neg Hx   . Asthma Neg Hx   . Eczema Neg Hx   . Immunodeficiency Neg Hx   . Urticaria Neg Hx     Social history: Social History   Socioeconomic History  . Marital status: Married    Spouse name: Not on file  . Number of children: 3  . Years of education: Not on file  . Highest education level: Not on file  Social Needs  . Financial resource strain: Not on file  . Food insecurity - worry: Not on file  . Food insecurity - inability: Not on file  . Transportation needs - medical: Not on file  . Transportation needs - non-medical: Not on file  Occupational History  . 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 Smoking status: Light Tobacco Smoker    Types: Cigarettes  . Smokeless tobacco: Never Used  . Tobacco comment: 2  pack a month  Substance and Sexual Activity  . Alcohol use: Yes    Alcohol/week: 6.0 oz    Types: 5 Glasses of wine, 5 Cans of beer per week  . Drug use: Yes    Types: Marijuana  . Sexual activity: Yes  Other Topics Concern  . Not on file  Social History Narrative   Remarried, 2 kids with  second wife    Environmental History: The patient lives in a 42 year old house with carpeting in the bedroom, gas heat, and central air.  There is a dog in the home which has access to his bedroom.  There is no known mold/water damage in the home.  He smokes socially.  Allergies as of 05/31/2017      Reactions   Dairy Aid [lactase] Other (See Comments)   Lactose Intolerant      Medication List        Accurate as of 05/31/17  5:12 PM. Always use your most recent med list.          FISH OIL PO Take 1 tablet by mouth daily.   mupirocin cream 2 % Commonly known as:  BACTROBAN Apply 1 application topically 2  (two) times daily.   VITAMIN C PO Take 1 tablet by mouth daily.   VITAMIN D PO Take by mouth.   Vitamin-B Complex Tabs Take 1 tablet by mouth daily.       Known medication allergies: Allergies  Allergen Reactions  . Dairy Aid [Lactase] Other (See Comments)    Lactose Intolerant    I appreciate the opportunity to take part in Jonnie's care. Please do not hesitate to contact me with questions.  Sincerely,   R. Edgar Frisk, MD

## 2017-05-31 NOTE — Patient Instructions (Addendum)
History of food allergy Gastrointestinal symptoms, uncertain etiology. Skin tests to select food allergens were negative today with the exception of borderline positive/equivocal to casein. The negative predictive value of food allergen skin testing is excellent (approximately 95%).  To be thorough, we will evaluate further with labs.  Negative skin tests and serum specific IgE levels tests do not rule out food intolerances or cell-mediated enteropathies which may lend to GI symptoms. These etiologies are suggested when elimination of the responsible food leads to symptom resolution and re-introduction of the food is followed by the return of symptoms.   A laboratory order has been provided for serum specific IgE against cow's milk, cow's milk components, and casein.  Until IgE mediated allergy has been definitively ruled out, continue avoidance of cow's milk.  The patient has been encouraged to keep a careful symptom/food journal and eliminate any food suspected of correlating with symptoms.   If GI symptoms persist or progress, gastroenterologist evaluation may be warranted.   When lab results have returned the patient will be called with further recommendations and follow up instructions.

## 2017-06-03 LAB — MILK COMPONENT PANEL
F076-IgE Alpha Lactalbumin: <0.1 kU/L
F077-IgE Beta Lactoglobulin: <0.1 kU/L
F078-IgE Casein: <0.1 kU/L

## 2017-06-03 LAB — ALLERGEN MILK: Milk IgE: <0.1 kU/L

## 2017-09-21 DIAGNOSIS — L700 Acne vulgaris: Secondary | ICD-10-CM | POA: Diagnosis not present

## 2018-02-22 ENCOUNTER — Encounter: Payer: Self-pay | Admitting: Internal Medicine

## 2018-02-22 ENCOUNTER — Ambulatory Visit (INDEPENDENT_AMBULATORY_CARE_PROVIDER_SITE_OTHER): Payer: BLUE CROSS/BLUE SHIELD | Admitting: Internal Medicine

## 2018-02-22 VITALS — BP 124/68 | HR 65 | Temp 98.2°F | Resp 16 | Ht 69.0 in | Wt 205.0 lb

## 2018-02-22 DIAGNOSIS — Z0000 Encounter for general adult medical examination without abnormal findings: Secondary | ICD-10-CM

## 2018-02-22 DIAGNOSIS — L989 Disorder of the skin and subcutaneous tissue, unspecified: Secondary | ICD-10-CM | POA: Diagnosis not present

## 2018-02-22 LAB — CBC WITH DIFFERENTIAL/PLATELET
BASOS PCT: 0.4 %
Basophils Absolute: 21 cells/uL (ref 0–200)
EOS PCT: 2.7 %
Eosinophils Absolute: 140 cells/uL (ref 15–500)
HEMATOCRIT: 43.8 % (ref 38.5–50.0)
HEMOGLOBIN: 15.1 g/dL (ref 13.2–17.1)
LYMPHS ABS: 1586 {cells}/uL (ref 850–3900)
MCH: 29.4 pg (ref 27.0–33.0)
MCHC: 34.5 g/dL (ref 32.0–36.0)
MCV: 85.2 fL (ref 80.0–100.0)
MPV: 11.3 fL (ref 7.5–12.5)
Monocytes Relative: 6.9 %
NEUTROS ABS: 3094 {cells}/uL (ref 1500–7800)
NEUTROS PCT: 59.5 %
Platelets: 193 10*3/uL (ref 140–400)
RBC: 5.14 10*6/uL (ref 4.20–5.80)
RDW: 12.3 % (ref 11.0–15.0)
Total Lymphocyte: 30.5 %
WBC: 5.2 10*3/uL (ref 3.8–10.8)
WBCMIX: 359 {cells}/uL (ref 200–950)

## 2018-02-22 LAB — LIPID PANEL
Cholesterol: 146 mg/dL (ref <200)
HDL: 35 mg/dL — ABNORMAL LOW (ref >40)
LDL Cholesterol (Calc): 79 mg/dL (calc)
Non-HDL Cholesterol (Calc): 111 mg/dL (calc) (ref <130)
TRIGLYCERIDES: 220 mg/dL — AB (ref <150)
Total CHOL/HDL Ratio: 4.2 (calc) (ref <5.0)

## 2018-02-22 LAB — COMPREHENSIVE METABOLIC PANEL
AG RATIO: 2.2 (calc) (ref 1.0–2.5)
ALT: 16 U/L (ref 9–46)
AST: 22 U/L (ref 10–40)
Albumin: 4.7 g/dL (ref 3.6–5.1)
Alkaline phosphatase (APISO): 64 U/L (ref 40–115)
BILIRUBIN TOTAL: 0.4 mg/dL (ref 0.2–1.2)
BUN: 22 mg/dL (ref 7–25)
CALCIUM: 9.2 mg/dL (ref 8.6–10.3)
CHLORIDE: 102 mmol/L (ref 98–110)
CO2: 24 mmol/L (ref 20–32)
Creat: 1.26 mg/dL (ref 0.60–1.35)
GLOBULIN: 2.1 g/dL (ref 1.9–3.7)
Glucose, Bld: 91 mg/dL (ref 65–99)
Potassium: 4.1 mmol/L (ref 3.5–5.3)
Sodium: 137 mmol/L (ref 135–146)
Total Protein: 6.8 g/dL (ref 6.1–8.1)

## 2018-02-22 LAB — TSH: TSH: 2.79 mIU/L (ref 0.40–4.50)

## 2018-02-22 NOTE — Assessment & Plan Note (Addendum)
-  Td 01-2015, declined flu shot -CCS: Never had a cscope  -Prostate cancer screening: Not indicated -Diet-exercise: Doing well. -Patient is a lighter smoker (counseled), drinks approximately 10 -12 drinks a week, does not feels guilty, does not feel he has any problems.  -Labs:cmp,flp,cbc,tsh -EKG nsr

## 2018-02-22 NOTE — Progress Notes (Signed)
Subjective:    Patient ID: Martin Pruitt, male    DOB: [birth year], 42 y.o.   MRN: [id number]  DOS:  02/22/2018 Type of visit - description : cpx Interval history: Doing well, no major concerns, still having issues with a skin lesion on the forehead.   Review of Systems  Other than above, a 14 point review of systems is negative    Past Medical History:  Diagnosis Date  . Lactose intolerance     Past Surgical History:  Procedure Laterality Date  . ANTERIOR CRUCIATE LIGAMENT REPAIR     bilaterally   . arm fracture  2013   plate (LEFT)  . THYROIDECTOMY     partial    Social History   Socioeconomic History  . Marital status: Married    Spouse name: Not on file  . Number of children: 3  . Years of education: Not on file  . Highest education level: Not on file  Occupational History  . 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 Financial resource strain: Not on file  . Food insecurity:    Worry: Not on file    Inability: Not on file  . Transportation needs:    Medical: Not on file    Non-medical: Not on file  Tobacco Use  . Smoking status: Light Tobacco Smoker    Types: Cigarettes  . Smokeless tobacco: Never Used  . Tobacco comment: 2  pack a month  Substance and Sexual Activity  . Alcohol use: Yes    Alcohol/week: 10.0 standard drinks    Types: 5 Glasses of wine, 5 Cans of beer per week  . Drug use: Yes    Types: Marijuana  . Sexual activity: Yes  Lifestyle  . Physical activity:    Days per week: Not on file    Minutes per session: Not on file  . Stress: Not on file  Relationships  . Social connections:    Talks on phone: Not on file    Gets together: Not on file    Attends religious service: Not on file    Active member of club or organization: Not on file    Attends meetings of clubs or organizations: Not on file    Relationship status: Not on file  . Intimate partner violence:    Fear of current or ex partner: Not on file      Emotionally abused: Not on file    Physically abused: Not on file    Forced sexual activity: Not on file  Other Topics Concern  . Not on file  Social History Narrative   Remarried, 2 kids with  second wife      Family History  Problem Relation Age of Onset  . Hypertension Mother   . Benign prostatic hyperplasia Father   . Sudden death Neg Hx   . Hyperlipidemia Neg Hx   . Heart attack Neg Hx   . Diabetes Neg Hx   . Colon cancer Neg Hx   . Prostate cancer Neg Hx   . Allergic rhinitis Neg Hx   . Angioedema Neg Hx   . Asthma Neg Hx   . Eczema Neg Hx   . Immunodeficiency Neg Hx   . Urticaria Neg Hx      Allergies as of 02/22/2018      Reactions   Dairy Aid [lactase] Other (See Comments)   Lactose Intolerant      Medication List  Accurate as of 02/22/18 11:59 PM. Always use your most recent med list.          FISH OIL PO Take 1 tablet by mouth daily.   mupirocin cream 2 % Commonly known as:  BACTROBAN Apply 1 application topically 2 (two) times daily.   VITAMIN C PO Take 1 tablet by mouth daily.   VITAMIN D PO Take by mouth.   VITAMIN E PO Take by mouth.   Vitamin-B Complex Tabs Take 1 tablet by mouth daily.          Objective:   Physical Exam  HENT:  Head:     BP 124/68 (BP Location: Right Arm, Patient Position: Sitting, Cuff Size: Small)   Pulse 65   Temp 98.2 F (36.8 C) (Oral)   Resp 16   Ht 5\' 9"  (1.753 m)   Wt 205 lb (93 kg)   SpO2 98%   BMI 30.27 kg/m  General: Well developed, NAD, see BMI.  Neck: No  thyromegaly  HEENT:  Normocephalic . Face symmetric, atraumatic Lungs:  CTA B Normal respiratory effort, no intercostal retractions, no accessory muscle use. Heart: RRR,  no murmur.  No pretibial edema bilaterally  Abdomen:  Not distended, soft, non-tender. No rebound or rigidity.    Neurologic:  alert & oriented X3.  Speech normal, gait appropriate for age and unassisted Strength symmetric and appropriate for age.   Psych: Cognition and judgment appear intact.  Cooperative with normal attention span and concentration.  Behavior appropriate. No anxious or depressed appearing.     Assessment & Plan:    Assessment Partial thyroidectomy  PLAN Here for CPX Skin lesion, forehead: Since the last visit, went to dermatology twice, the second time the area was injected with the steroids, reports no improvement.  Refer to dermatology again for a second opinion. RTC 1 year

## 2018-02-22 NOTE — Progress Notes (Signed)
Pre visit review using our clinic review tool, if applicable. No additional management support is needed unless otherwise documented below in the visit note. 

## 2018-02-22 NOTE — Patient Instructions (Signed)
GO TO THE LAB : Get the blood work     GO TO THE FRONT DESK Schedule your next appointment for a  Physical in 1 year  

## 2018-02-24 NOTE — Assessment & Plan Note (Signed)
Here for CPX Skin lesion, forehead: Since the last visit, went to dermatology twice, the second time the area was injected with the steroids, reports no improvement.  Refer to dermatology again for a second opinion. RTC 1 year

## 2018-03-13 ENCOUNTER — Telehealth: Payer: Self-pay

## 2018-03-13 NOTE — Telephone Encounter (Signed)
Copied from [place] [id number]. Topic: Referral - Question >> Mar 13, 2018  3:28 PM Judyann Munson wrote: Reason for CRM: Patient wife is calling to request a another dermatologist that we high recommend. The referral that was sent over for [facility] advise not available appt till March. She stated her insurance doesn't require a referral.  So if we can give her a call with other location in [place].  Please advise

## 2018-03-13 NOTE — Telephone Encounter (Signed)
Spoke w/ Pt's wife Jamas Lav- informed of [facility] derm office information. Also informed that they may not have any sooner appts. Jamas Lav verbalized understanding.

## 2018-04-08 DIAGNOSIS — L281 Prurigo nodularis: Secondary | ICD-10-CM | POA: Diagnosis not present

## 2018-05-07 DIAGNOSIS — L281 Prurigo nodularis: Secondary | ICD-10-CM | POA: Diagnosis not present

## 2018-05-07 DIAGNOSIS — D485 Neoplasm of uncertain behavior of skin: Secondary | ICD-10-CM | POA: Diagnosis not present

## 2018-05-07 DIAGNOSIS — L705 Acne excoriee des jeunes filles: Secondary | ICD-10-CM | POA: Diagnosis not present

## 2018-05-23 DIAGNOSIS — L281 Prurigo nodularis: Secondary | ICD-10-CM | POA: Diagnosis not present

## 2018-06-28 DIAGNOSIS — L281 Prurigo nodularis: Secondary | ICD-10-CM | POA: Diagnosis not present

## 2018-09-19 DIAGNOSIS — S90454A Superficial foreign body, right lesser toe(s), initial encounter: Secondary | ICD-10-CM | POA: Diagnosis not present

## 2018-09-19 DIAGNOSIS — M25521 Pain in right elbow: Secondary | ICD-10-CM | POA: Diagnosis not present

## 2019-02-28 ENCOUNTER — Encounter: Payer: BLUE CROSS/BLUE SHIELD | Admitting: Internal Medicine

## 2019-03-17 ENCOUNTER — Other Ambulatory Visit: Payer: Self-pay

## 2019-03-18 ENCOUNTER — Ambulatory Visit (INDEPENDENT_AMBULATORY_CARE_PROVIDER_SITE_OTHER): Payer: BC Managed Care – PPO | Admitting: Internal Medicine

## 2019-03-18 ENCOUNTER — Encounter: Payer: Self-pay | Admitting: Internal Medicine

## 2019-03-18 VITALS — BP 132/79 | HR 71 | Temp 97.1°F | Resp 16 | Ht 69.0 in | Wt 200.4 lb

## 2019-03-18 DIAGNOSIS — Z0000 Encounter for general adult medical examination without abnormal findings: Secondary | ICD-10-CM | POA: Diagnosis not present

## 2019-03-18 DIAGNOSIS — L989 Disorder of the skin and subcutaneous tissue, unspecified: Secondary | ICD-10-CM

## 2019-03-18 NOTE — Progress Notes (Signed)
Pre visit review using our clinic review tool, if applicable. No additional management support is needed unless otherwise documented below in the visit note. 

## 2019-03-18 NOTE — Patient Instructions (Signed)
  GO TO THE FRONT DESK Schedule your next appointment   for a physical exam in 1 year  We are referring you to the plastic surgeon about the lesion in your forehead that has increased a little in size.

## 2019-03-18 NOTE — Progress Notes (Signed)
Subjective:    Patient ID: Martin Pruitt, male    DOB: 09/09/1975, 43 y.o.   MRN: [id number]  DOS:  03/18/2019 Type of visit - description: CPX Since the last office visit he is doing okay. Continue to have a lesion of the forehead, it has grown in size a little bit.   Wt Readings from Last 3 Encounters:  03/18/19 200 lb 6 oz (90.9 kg)  02/22/18 205 lb (93 kg)  05/31/17 206 lb 9.6 oz (93.7 kg)     Review of Systems  Admits to some stress from [employer].  No anxiety or depression per se  Other than above, a 14 point review of systems is negative    Past Medical History:  Diagnosis Date  . Lactose intolerance     Past Surgical History:  Procedure Laterality Date  . ANTERIOR CRUCIATE LIGAMENT REPAIR     bilaterally   . arm fracture  2013   plate (LEFT)  . THYROIDECTOMY     partial    Social History   Socioeconomic History  . Marital status: Married    Spouse name: Not on file  . Number of children: 3  . Years of education: Not on file  . Highest education level: Not on file  Occupational History  . 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 Financial resource strain: Not on file  . Food insecurity    Worry: Not on file    Inability: Not on file  . Transportation needs    Medical: Not on file    Non-medical: Not on file  Tobacco Use  . Smoking status: Light Tobacco Smoker    Types: Cigarettes  . Smokeless tobacco: Never Used  . Tobacco comment: 1 pack/week  Substance and Sexual Activity  . Alcohol use: Yes    Alcohol/week: 10.0 standard drinks    Types: 5 Glasses of wine, 5 Cans of beer per week  . Drug use: Yes    Types: Marijuana  . Sexual activity: Yes  Lifestyle  . Physical activity    Days per week: Not on file    Minutes per session: Not on file  . Stress: Not on file  Relationships  . Social Herbalist on phone: Not on file    Gets together: Not on file    Attends religious service: Not on file     Active member of club or organization: Not on file    Attends meetings of clubs or organizations: Not on file    Relationship status: Not on file  . Intimate partner violence    Fear of current or ex partner: Not on file    Emotionally abused: Not on file    Physically abused: Not on file    Forced sexual activity: Not on file  Other Topics Concern  . Not on file  Social History Narrative   Remarried,    2 kids with  second wife    2010   2008     Family History  Problem Relation Age of Onset  . Hypertension Mother   . Benign prostatic hyperplasia Father   . Sudden death Neg Hx   . Hyperlipidemia Neg Hx   . Heart attack Neg Hx   . Diabetes Neg Hx   . Colon cancer Neg Hx   . Prostate cancer Neg Hx   . Allergic rhinitis Neg Hx   . Angioedema Neg Hx   . Asthma  Neg Hx   . Eczema Neg Hx   . Immunodeficiency Neg Hx   . Urticaria Neg Hx      Allergies as of 03/18/2019      Reactions   Dairy Aid [lactase] Other (See Comments)   Lactose Intolerant      Medication List       Accurate as of March 18, 2019 11:59 PM. If you have any questions, ask your nurse or doctor.        STOP taking these medications   mupirocin cream 2 % Commonly known as: BACTROBAN Stopped by: Kathlene November, MD   VITAMIN C PO Stopped by: Kathlene November, MD   VITAMIN D PO Stopped by: Kathlene November, MD   VITAMIN E PO Stopped by: Kathlene November, MD   Vitamin-B Complex Tabs Stopped by: Kathlene November, MD     TAKE these medications   FISH OIL PO Take 1 tablet by mouth daily.           Objective:   Physical Exam HENT:     Head:     BP 132/79 (BP Location: Left Arm, Patient Position: Sitting, Cuff Size: Normal)   Pulse 71   Temp (!) 97.1 F (36.2 C) (Temporal)   Resp 16   Ht 5\' 9"  (1.753 m)   Wt 200 lb 6 oz (90.9 kg)   SpO2 99%   BMI 29.59 kg/m  General: Well developed, NAD, BMI noted Neck: No  thyromegaly  HEENT:  Normocephalic . Face symmetric, atraumatic Lungs:  CTA B Normal  respiratory effort, no intercostal retractions, no accessory muscle use. Heart: RRR,  no murmur.  No pretibial edema bilaterally  Abdomen:  Not distended, soft, non-tender. No rebound or rigidity.   Skin: Exposed areas without rash. Not pale. Not jaundice Neurologic:  alert & oriented X3.  Speech normal, gait appropriate for age and unassisted Strength symmetric and appropriate for age.  Psych: Cognition and judgment appear intact.  Cooperative with normal attention span and concentration.  Behavior appropriate. No anxious or depressed appearing.     Assessment      Assessment Partial thyroidectomy  PLAN Here for CPX Skin lesion, forehead: Since the last visit, he did have a second dermatology opinion but apparently no definite treatment was Rx.  He feels the lesion is a slightly increase in size. Plan: Refer to plastic surgery for consideration of excision. RTC 1 year

## 2019-03-18 NOTE — Assessment & Plan Note (Addendum)
-  Td 01-2015, declined flu shot, benefits discussed. -CCS: Never had a cscope  -Prostate cancer screening: Not indicated -Diet: healthy; exercising less than before, counseled  -Patient is smoking a little more (1 pack a week) but he is not ready to quit, counseled about risks.  Recommend to see a dentist regularly.   EtOH consumption is at baseline, no more than 2 drinks daily on average -Labs, no fasting: CMP, FLP

## 2019-03-19 LAB — LIPID PANEL
Cholesterol: 186 mg/dL (ref 0–200)
HDL: 39.4 mg/dL (ref >39.00)
NonHDL: 147
Total CHOL/HDL Ratio: 5
Triglycerides: 305 mg/dL — ABNORMAL HIGH (ref 0.0–149.0)
VLDL: 61 mg/dL — ABNORMAL HIGH (ref 0.0–40.0)

## 2019-03-19 LAB — COMPREHENSIVE METABOLIC PANEL
ALT: 22 U/L (ref 0–53)
AST: 25 U/L (ref 0–37)
Albumin: 4.7 g/dL (ref 3.5–5.2)
Alkaline Phosphatase: 54 U/L (ref 39–117)
BUN: 16 mg/dL (ref 6–23)
CO2: 28 mEq/L (ref 19–32)
Calcium: 9.5 mg/dL (ref 8.4–10.5)
Chloride: 101 mEq/L (ref 96–112)
Creatinine, Ser: 1.52 mg/dL — ABNORMAL HIGH (ref 0.40–1.50)
GFR: 50.21 mL/min — ABNORMAL LOW (ref >60.00)
Glucose, Bld: 88 mg/dL (ref 70–99)
Potassium: 4.3 mEq/L (ref 3.5–5.1)
Sodium: 137 mEq/L (ref 135–145)
Total Bilirubin: 0.6 mg/dL (ref 0.2–1.2)
Total Protein: 7.1 g/dL (ref 6.0–8.3)

## 2019-03-19 LAB — LDL CHOLESTEROL, DIRECT: Direct LDL: 101 mg/dL

## 2019-03-20 NOTE — Assessment & Plan Note (Signed)
Here for CPX Skin lesion, forehead: Since the last visit, he did have a second dermatology opinion but apparently no definite treatment was Rx.  He feels the lesion is a slightly increase in size. Plan: Refer to plastic surgery for consideration of excision. RTC 1 year

## 2019-04-14 ENCOUNTER — Institutional Professional Consult (permissible substitution): Payer: BC Managed Care – PPO | Admitting: Plastic Surgery

## 2019-05-01 ENCOUNTER — Encounter: Payer: Self-pay | Admitting: Plastic Surgery

## 2019-05-01 ENCOUNTER — Ambulatory Visit: Payer: BC Managed Care – PPO | Admitting: Plastic Surgery

## 2019-05-01 ENCOUNTER — Other Ambulatory Visit: Payer: Self-pay

## 2019-05-01 VITALS — BP 139/90 | HR 73 | Temp 98.0°F | Ht 69.5 in | Wt 200.0 lb

## 2019-05-01 DIAGNOSIS — D2339 Other benign neoplasm of skin of other parts of face: Secondary | ICD-10-CM | POA: Diagnosis not present

## 2019-05-01 NOTE — Progress Notes (Signed)
Referring Provider Colon Branch, MD [address] [place],  [place] [address]   CC: No chief complaint on file. Forehead cyst  Martin Pruitt is an 43 y.o. male.  HPI: Patient presents with a forehead cyst present for the past 2 years.  He has had multiple treatments at the dermatology office.  They have biopsied it and he said it was benign.  There is also injected it with corticosteroids a couple times.  He says it has not really changed much with any the topical treatments that they have tried.  He is bothered by the appearance and there is some surrounding erythema and irritation.  It does itch and intermittently bothers him when he wears his motorcycle helmet.  Allergies  Allergen Reactions  . Dairy Aid [Lactase] Other (See Comments)    Lactose Intolerant    Outpatient Encounter Medications as of 05/01/2019  Medication Sig  . Omega-3 Fatty Acids (FISH OIL PO) Take 1 tablet by mouth daily.   No facility-administered encounter medications on file as of 05/01/2019.      Past Medical History:  Diagnosis Date  . Lactose intolerance     Past Surgical History:  Procedure Laterality Date  . ANTERIOR CRUCIATE LIGAMENT REPAIR     bilaterally   . arm fracture  2013   plate (LEFT)  . THYROIDECTOMY     partial    Family History  Problem Relation Age of Onset  . Hypertension Mother   . Benign prostatic hyperplasia Father   . Sudden death Neg Hx   . Hyperlipidemia Neg Hx   . Heart attack Neg Hx   . Diabetes Neg Hx   . Colon cancer Neg Hx   . Prostate cancer Neg Hx   . Allergic rhinitis Neg Hx   . Angioedema Neg Hx   . Asthma Neg Hx   . Eczema Neg Hx   . Immunodeficiency Neg Hx   . Urticaria Neg Hx     Social History   Social History Narrative   Remarried,    2 kids with  second wife    2010   2008     Review of Systems General: Denies fevers, chills, weight loss CV: Denies chest pain, shortness of breath, palpitations   Physical Exam Vitals  with BMI 05/01/2019 03/18/2019 02/22/2018  Height 5' 9.5" 5\' 9"  5\' 9"   Weight 200 lbs 200 lbs 6 oz 205 lbs  BMI 29.12 0000000 [id number]  Systolic [id number]  Diastolic 90 79 68  Pulse 73 71 65    General:  No acute distress,  Alert and oriented, Non-Toxic, Normal speech and affect HEENT: Normocephalic atraumatic.  Cranial nerves grossly intact.  Extraocular is intact.  He has around a 1 to 1.5 cm erythematous cystic lesion in the right glabella.  There is mild surrounding erythema.  There is no fluctuance.  The skin is raised a bit in that area.  Assessment/Plan Patient presents with a symptomatic cyst of the right glabella.  It sounds like he is exhausted all less invasive measures.  I offered excision.  I did explain that this would generate a scar in that area which would be noticeable.  I do however believe that it would be less noticeable than the lesion is currently.  I discussed the risk that include bleeding, infection, damage surrounding structures, need for additional procedures.  I discussed where the scar would be in my anticipated orientation.  He is going to think  about this and if he wants to go ahead we will schedule a local excision in the office.  Cindra Presume 05/01/2019, 2:45 PM

## 2019-06-12 ENCOUNTER — Ambulatory Visit: Payer: BC Managed Care – PPO | Admitting: Plastic Surgery

## 2019-10-11 DIAGNOSIS — Z23 Encounter for immunization: Secondary | ICD-10-CM | POA: Diagnosis not present

## 2019-11-01 DIAGNOSIS — Z23 Encounter for immunization: Secondary | ICD-10-CM | POA: Diagnosis not present

## 2019-11-04 DIAGNOSIS — L281 Prurigo nodularis: Secondary | ICD-10-CM | POA: Diagnosis not present

## 2019-12-09 DIAGNOSIS — L281 Prurigo nodularis: Secondary | ICD-10-CM | POA: Diagnosis not present

## 2020-01-16 DIAGNOSIS — Z20822 Contact with and (suspected) exposure to covid-19: Secondary | ICD-10-CM | POA: Diagnosis not present

## 2020-01-26 DIAGNOSIS — L281 Prurigo nodularis: Secondary | ICD-10-CM | POA: Diagnosis not present

## 2020-01-26 DIAGNOSIS — L700 Acne vulgaris: Secondary | ICD-10-CM | POA: Diagnosis not present

## 2020-03-17 ENCOUNTER — Encounter: Payer: Self-pay | Admitting: Plastic Surgery

## 2020-03-17 ENCOUNTER — Ambulatory Visit: Payer: BC Managed Care – PPO | Admitting: Plastic Surgery

## 2020-03-17 ENCOUNTER — Other Ambulatory Visit: Payer: Self-pay

## 2020-03-17 VITALS — BP 150/82 | HR 89 | Temp 98.1°F

## 2020-03-17 DIAGNOSIS — D2339 Other benign neoplasm of skin of other parts of face: Secondary | ICD-10-CM | POA: Diagnosis not present

## 2020-03-17 NOTE — Progress Notes (Signed)
   Referring Provider Colon Branch, [address] [place],  [place] [address]   CC:  Chief Complaint  Patient presents with  . Follow-up      Martin Pruitt is an 44 y.o. male.  HPI: Patient presents to discuss a cystic lesion of the forehead.  We had discussed this prior and he was interested in excision but wanted to put it off to see if it would go away on its own.  It still there and is bothering him.  It intermittently drains and is prominent enough that he catches it on things.  He would like to have it removed.  Review of Systems General: Denies fevers or chills  Physical Exam Vitals with BMI 03/17/2020 05/01/2019 03/18/2019  Height - 5' 9.5" 5\' 9"   Weight - 200 lbs 200 lbs 6 oz  BMI - 54.65 03.54  Systolic 656 812 751  Diastolic 82 90 79  Pulse 89 73 71    General:  No acute distress,  Alert and oriented, Non-Toxic, Normal speech and affect Examination shows an approximately 1.5 to 2 cm cystic lesion in the glabella area.  He has a punctum.  The overlying skin is atrophied but there is no overlying skin changes around the edges.  There is no fluctuance or signs of infection.  Assessment/Plan Patient has what appears to be a subcutaneous sebaceous cyst on the glabella.  We discussed excision.  We discussed the risk include bleeding, infection, damage to surrounding structures and need for additional procedures.  We discussed the potential for recurrence and the location and orientation of the scar.  All of his questions were answered and we will plan to get this scheduled under local.  Cindra Presume 03/17/2020, 6:53 PM

## 2020-03-19 ENCOUNTER — Encounter: Payer: Self-pay | Admitting: Internal Medicine

## 2020-03-19 ENCOUNTER — Other Ambulatory Visit: Payer: Self-pay

## 2020-03-19 ENCOUNTER — Ambulatory Visit (INDEPENDENT_AMBULATORY_CARE_PROVIDER_SITE_OTHER): Payer: BC Managed Care – PPO | Admitting: Internal Medicine

## 2020-03-19 VITALS — BP 132/78 | HR 78 | Temp 97.7°F | Resp 16 | Ht 70.0 in | Wt 203.5 lb

## 2020-03-19 DIAGNOSIS — Z1159 Encounter for screening for other viral diseases: Secondary | ICD-10-CM | POA: Diagnosis not present

## 2020-03-19 DIAGNOSIS — Z0000 Encounter for general adult medical examination without abnormal findings: Secondary | ICD-10-CM | POA: Diagnosis not present

## 2020-03-19 NOTE — Patient Instructions (Signed)
   GO TO THE LAB : Get the blood work     [place], [place] Come back for physical exam in 1 year

## 2020-03-19 NOTE — Progress Notes (Signed)
   Subjective:    Patient ID: Martin Pruitt, male    DOB: [birth year], 44 y.o.   MRN: [id number]  DOS:  03/19/2020 Type of visit - description: CPX  Since the last office visit a year ago he is doing well. Has no concerns  Wt Readings from Last 3 Encounters:  03/19/20 203 lb 8 oz (92.3 kg)  05/01/19 200 lb (90.7 kg)  03/18/19 200 lb 6 oz (90.9 kg)     Review of Systems   A 14 point review of systems is negative    Past Medical History:  Diagnosis Date  . Lactose intolerance     Past Surgical History:  Procedure Laterality Date  . ANTERIOR CRUCIATE LIGAMENT REPAIR     bilaterally   . arm fracture  2013   plate (LEFT)  . THYROIDECTOMY     partial    Allergies as of 03/19/2020      Reactions   Dairy Aid [lactase] Other (See Comments)   Lactose Intolerant      Medication List       Accurate as of March 19, 2020 11:59 PM. If you have any questions, ask your nurse or doctor.        clobetasol ointment 0.05 % Commonly known as: TEMOVATE SMARTSIG:1 Topical Every Night   FISH OIL PO Take 1 tablet by mouth daily.          Objective:   Physical Exam BP 132/78 (BP Location: Left Arm, Patient Position: Sitting, Cuff Size: Normal)   Pulse 78   Temp 97.7 F (36.5 C) (Oral)   Resp 16   Ht 5\' 10"  (1.778 m)   Wt 203 lb 8 oz (92.3 kg)   SpO2 100%   BMI 29.20 kg/m  General: Well developed, NAD, BMI noted Neck: No  thyromegaly  HEENT:  Normocephalic . Face symmetric, atraumatic Lungs:  CTA B Normal respiratory effort, no intercostal retractions, no accessory muscle use. Heart: RRR,  no murmur.  Abdomen:  Not distended, soft, non-tender. No rebound or rigidity.   Lower extremities: no pretibial edema bilaterally  Skin: Exposed areas without rash. Not pale. Not jaundice Neurologic:  alert & oriented X3.  Speech normal, gait appropriate for age and unassisted Strength symmetric and appropriate for age.  Psych: Cognition and judgment appear intact.   Cooperative with normal attention span and concentration.  Behavior appropriate. No anxious or depressed appearing.     Assessment     Assessment Partial thyroidectomy  PLAN Here for CPX Doing well. RTC 1 year    This visit occurred during the SARS-CoV-2 public health emergency.  Safety protocols were in place, including screening questions prior to the visit, additional usage of staff PPE, and extensive cleaning of exam room while observing appropriate contact time as indicated for disinfecting solutions.

## 2020-03-19 NOTE — Progress Notes (Signed)
Pre visit review using our clinic review tool, if applicable. No additional management support is needed unless otherwise documented below in the visit note. 

## 2020-03-21 ENCOUNTER — Encounter: Payer: Self-pay | Admitting: Internal Medicine

## 2020-03-21 NOTE — Assessment & Plan Note (Signed)
Here for CPX Doing well. RTC 1 year 

## 2020-03-21 NOTE — Assessment & Plan Note (Signed)
-  Td 01-2015 - s/p pfizer covid vax, rec booster ~ 04/2020, he will think about it - flu shot declines. -CCS: Never had a cscope.  Not indicated -Prostate cancer screening: Not indicated -Diet is healthy most of the time, unable to lose weight, recommend portion control among other strategies.  He remains very active. -Patient still smokes, less than before. -Labs reviewed, will do a BMP, CBC, TSH, hep C.  He is not fasting, last LDL pretty good >>> FLP next year.  States he has cholesterol checked at work, advised to send me a copy.

## 2020-03-22 LAB — BASIC METABOLIC PANEL
BUN: 20 mg/dL (ref 7–25)
CO2: 27 mmol/L (ref 20–32)
Calcium: 9.4 mg/dL (ref 8.6–10.3)
Chloride: 104 mmol/L (ref 98–110)
Creat: 1.24 mg/dL (ref 0.60–1.35)
Glucose, Bld: 94 mg/dL (ref 65–99)
Potassium: 4.7 mmol/L (ref 3.5–5.3)
Sodium: 139 mmol/L (ref 135–146)

## 2020-03-22 LAB — CBC WITH DIFFERENTIAL/PLATELET
Absolute Monocytes: 455 cells/uL (ref 200–950)
Basophils Absolute: 39 cells/uL (ref 0–200)
Basophils Relative: 0.6 %
Eosinophils Absolute: 228 cells/uL (ref 15–500)
Eosinophils Relative: 3.5 %
HCT: 46 % (ref 38.5–50.0)
Hemoglobin: 16.1 g/dL (ref 13.2–17.1)
Lymphs Abs: 2022 cells/uL (ref 850–3900)
MCH: 30.7 pg (ref 27.0–33.0)
MCHC: 35 g/dL (ref 32.0–36.0)
MCV: 87.8 fL (ref 80.0–100.0)
MPV: 11.2 fL (ref 7.5–12.5)
Monocytes Relative: 7 %
Neutro Abs: 3757 cells/uL (ref 1500–7800)
Neutrophils Relative %: 57.8 %
Platelets: 190 10*3/uL (ref 140–400)
RBC: 5.24 10*6/uL (ref 4.20–5.80)
RDW: 12.3 % (ref 11.0–15.0)
Total Lymphocyte: 31.1 %
WBC: 6.5 10*3/uL (ref 3.8–10.8)

## 2020-03-22 LAB — HEPATITIS C ANTIBODY
Hepatitis C Ab: NONREACTIVE
SIGNAL TO CUT-OFF: 0.01 (ref <1.00)

## 2020-03-22 LAB — TSH: TSH: 2.11 mIU/L (ref 0.40–4.50)

## 2020-04-05 ENCOUNTER — Other Ambulatory Visit: Payer: Self-pay

## 2020-04-05 ENCOUNTER — Ambulatory Visit: Payer: BC Managed Care – PPO | Admitting: Plastic Surgery

## 2020-04-05 ENCOUNTER — Other Ambulatory Visit (HOSPITAL_COMMUNITY)
Admission: RE | Admit: 2020-04-05 | Discharge: 2020-04-05 | Disposition: A | Discharge status: Home / Self Care | Payer: BC Managed Care – PPO | Source: Ambulatory Visit | Attending: Plastic Surgery | Admitting: Plastic Surgery

## 2020-04-05 ENCOUNTER — Encounter: Payer: Self-pay | Admitting: Plastic Surgery

## 2020-04-05 VITALS — BP 125/88 | HR 73 | Temp 97.8°F

## 2020-04-05 DIAGNOSIS — L281 Prurigo nodularis: Secondary | ICD-10-CM | POA: Diagnosis not present

## 2020-04-05 DIAGNOSIS — D2339 Other benign neoplasm of skin of other parts of face: Secondary | ICD-10-CM | POA: Diagnosis not present

## 2020-04-05 DIAGNOSIS — L720 Epidermal cyst: Secondary | ICD-10-CM | POA: Diagnosis not present

## 2020-04-05 NOTE — Progress Notes (Signed)
Operative Note   DATE OF OPERATION: 04/05/2020  LOCATION:    SURGICAL DEPARTMENT: Plastic Surgery  PREOPERATIVE DIAGNOSES: Right glabellar cyst  POSTOPERATIVE DIAGNOSES:  same  PROCEDURE:  1. Excision of right glabellar cyst measuring 2 cm 2. Complex closure measuring 2 cm  SURGEON: Talmadge Coventry, MD  ANESTHESIA:  Local  COMPLICATIONS: None.   INDICATIONS FOR PROCEDURE:  The patient, Martin Pruitt is a 44 y.o. male born on 1975-08-11, is here for treatment of right glabellar cyst MRN: [id number]  CONSENT:  Informed consent was obtained directly from the patient. Risks, benefits and alternatives were fully discussed. Specific risks including but not limited to bleeding, infection, hematoma, seroma, scarring, pain, infection, wound healing problems, and need for further surgery were all discussed. The patient did have an ample opportunity to have questions answered to satisfaction.   DESCRIPTION OF PROCEDURE:  Local anesthesia was administered. The patient's operative site was prepped and draped in a sterile fashion. A time out was performed and all information was confirmed to be correct.  The lesion was excised with a 15 blade.  Hemostasis was obtained.  Circumferential undermining was performed and the skin was advanced and closed in layers with interrupted buried Monocryl sutures and 5-0 fast gut for the skin.  The lesion excised measured 2 cm, and the total length of closure measured 2 cm.    The patient tolerated the procedure well.  There were no complications.

## 2020-04-09 LAB — SURGICAL PATHOLOGY

## 2020-04-28 ENCOUNTER — Ambulatory Visit: Payer: BC Managed Care – PPO | Admitting: Plastic Surgery

## 2020-05-04 DIAGNOSIS — K921 Melena: Secondary | ICD-10-CM | POA: Diagnosis not present

## 2020-05-04 DIAGNOSIS — K649 Unspecified hemorrhoids: Secondary | ICD-10-CM | POA: Diagnosis not present

## 2020-05-29 HISTORY — PX: COLONOSCOPY W/ POLYPECTOMY: SHX1380

## 2020-06-01 DIAGNOSIS — B1910 Unspecified viral hepatitis B without hepatic coma: Secondary | ICD-10-CM | POA: Diagnosis not present

## 2020-06-01 DIAGNOSIS — K649 Unspecified hemorrhoids: Secondary | ICD-10-CM | POA: Diagnosis not present

## 2020-06-01 DIAGNOSIS — K921 Melena: Secondary | ICD-10-CM | POA: Diagnosis not present

## 2020-06-01 DIAGNOSIS — R899 Unspecified abnormal finding in specimens from other organs, systems and tissues: Secondary | ICD-10-CM | POA: Diagnosis not present

## 2020-06-05 DIAGNOSIS — R768 Other specified abnormal immunological findings in serum: Secondary | ICD-10-CM | POA: Diagnosis not present

## 2020-06-05 DIAGNOSIS — K921 Melena: Secondary | ICD-10-CM | POA: Diagnosis not present

## 2020-06-05 DIAGNOSIS — Z01818 Encounter for other preprocedural examination: Secondary | ICD-10-CM | POA: Diagnosis not present

## 2020-06-11 DIAGNOSIS — B1910 Unspecified viral hepatitis B without hepatic coma: Secondary | ICD-10-CM | POA: Diagnosis not present

## 2020-06-18 DIAGNOSIS — Z1211 Encounter for screening for malignant neoplasm of colon: Secondary | ICD-10-CM | POA: Diagnosis not present

## 2020-06-18 DIAGNOSIS — K635 Polyp of colon: Secondary | ICD-10-CM | POA: Diagnosis not present

## 2020-06-18 DIAGNOSIS — K921 Melena: Secondary | ICD-10-CM | POA: Diagnosis not present

## 2020-06-18 DIAGNOSIS — Z01818 Encounter for other preprocedural examination: Secondary | ICD-10-CM | POA: Diagnosis not present

## 2020-06-18 LAB — HM COLONOSCOPY

## 2020-06-22 DIAGNOSIS — K635 Polyp of colon: Secondary | ICD-10-CM | POA: Diagnosis not present

## 2020-07-02 ENCOUNTER — Emergency Department (HOSPITAL_COMMUNITY)
Admission: EM | Admit: 2020-07-02 | Discharge: 2020-07-03 | Disposition: A | Discharge status: Home / Self Care | Payer: BC Managed Care – PPO | Attending: Emergency Medicine | Admitting: Emergency Medicine

## 2020-07-02 DIAGNOSIS — Z5321 Procedure and treatment not carried out due to patient leaving prior to being seen by health care provider: Secondary | ICD-10-CM | POA: Diagnosis not present

## 2020-07-02 DIAGNOSIS — K625 Hemorrhage of anus and rectum: Secondary | ICD-10-CM | POA: Insufficient documentation

## 2020-07-02 LAB — CBC
HCT: 40.7 % (ref 39.0–52.0)
Hemoglobin: 13.8 g/dL (ref 13.0–17.0)
MCH: 29.7 pg (ref 26.0–34.0)
MCHC: 33.9 g/dL (ref 30.0–36.0)
MCV: 87.7 fL (ref 80.0–100.0)
Platelets: 271 10*3/uL (ref 150–400)
RBC: 4.64 MIL/uL (ref 4.22–5.81)
RDW: 12 % (ref 11.5–15.5)
WBC: 12.8 10*3/uL — ABNORMAL HIGH (ref 4.0–10.5)
nRBC: 0 % (ref 0.0–0.2)

## 2020-07-02 LAB — COMPREHENSIVE METABOLIC PANEL
ALT: 22 U/L (ref 0–44)
AST: 22 U/L (ref 15–41)
Albumin: 3.9 g/dL (ref 3.5–5.0)
Alkaline Phosphatase: 48 U/L (ref 38–126)
Anion gap: 10 (ref 5–15)
BUN: 17 mg/dL (ref 6–20)
CO2: 23 mmol/L (ref 22–32)
Calcium: 8.9 mg/dL (ref 8.9–10.3)
Chloride: 105 mmol/L (ref 98–111)
Creatinine, Ser: 1.82 mg/dL — ABNORMAL HIGH (ref 0.61–1.24)
GFR, Estimated: 46 mL/min — ABNORMAL LOW (ref >60)
Glucose, Bld: 94 mg/dL (ref 70–99)
Potassium: 3.9 mmol/L (ref 3.5–5.1)
Sodium: 138 mmol/L (ref 135–145)
Total Bilirubin: 0.7 mg/dL (ref 0.3–1.2)
Total Protein: 6.2 g/dL — ABNORMAL LOW (ref 6.5–8.1)

## 2020-07-02 LAB — TYPE AND SCREEN
ABO/RH(D): A POS
Antibody Screen: NEGATIVE

## 2020-07-02 LAB — ABO/RH: ABO/RH(D): A POS

## 2020-07-02 NOTE — ED Triage Notes (Signed)
Pt presents to ED POV. Pt c/o rectal bleeding beginning this evening. Pt reports that he had 2 large bowel movements of dark red blood. Pt reports that 2w ago pt had colonoscopy and had polyps frozen and hemorrhoids tied off. Pt also reports after having second bowel movement he had a syncopal event. Pt AAO x4

## 2020-07-03 NOTE — ED Notes (Signed)
Pt left due to not being seen quick enough 

## 2020-07-06 DIAGNOSIS — Z01818 Encounter for other preprocedural examination: Secondary | ICD-10-CM | POA: Diagnosis not present

## 2020-07-06 DIAGNOSIS — Z9229 Personal history of other drug therapy: Secondary | ICD-10-CM | POA: Diagnosis not present

## 2020-07-06 DIAGNOSIS — K648 Other hemorrhoids: Secondary | ICD-10-CM | POA: Diagnosis not present

## 2020-07-06 DIAGNOSIS — D126 Benign neoplasm of colon, unspecified: Secondary | ICD-10-CM | POA: Diagnosis not present

## 2020-07-16 ENCOUNTER — Telehealth: Payer: Self-pay

## 2020-07-16 NOTE — Telephone Encounter (Signed)
Left message to call back. We received pt's Gi records from [facility]. Records placed in referral folder, need to know if pt wants to transfer his care.

## 2020-07-19 NOTE — Telephone Encounter (Signed)
Per pt's spouse is requesting for the records to be reviewed, will send to Dr Carlean Purl, pt wishes to transfer his GI care for a second opinion and a new GI provider.  He had a colonoscopy done in 06/18/2020, will send the records for you to review. Please advise on scheduling

## 2020-07-20 NOTE — Telephone Encounter (Signed)
Happy to accept this patient

## 2020-07-21 NOTE — Telephone Encounter (Signed)
Left msg on pt's vm to call back  Will place records in referral folder inside cabinet

## 2020-08-18 ENCOUNTER — Encounter: Payer: Self-pay | Admitting: Internal Medicine

## 2020-08-18 ENCOUNTER — Ambulatory Visit: Payer: BC Managed Care – PPO | Admitting: Internal Medicine

## 2020-08-18 ENCOUNTER — Other Ambulatory Visit: Payer: Self-pay

## 2020-08-18 VITALS — BP 128/80 | HR 88 | Ht 68.5 in | Wt 210.0 lb

## 2020-08-18 DIAGNOSIS — K601 Chronic anal fissure: Secondary | ICD-10-CM

## 2020-08-18 DIAGNOSIS — Z86010 Personal history of colonic polyps: Secondary | ICD-10-CM | POA: Diagnosis not present

## 2020-08-18 DIAGNOSIS — Z860101 Personal history of adenomatous and serrated colon polyps: Secondary | ICD-10-CM | POA: Insufficient documentation

## 2020-08-18 HISTORY — DX: Personal history of adenomatous and serrated colon polyps: Z86.0101

## 2020-08-18 HISTORY — DX: Personal history of colonic polyps: Z86.010

## 2020-08-18 MED ORDER — AMBULATORY NON FORMULARY MEDICATION: 3 refills | Status: DC

## 2020-08-18 NOTE — Progress Notes (Signed)
Martin Pruitt 45 y.o. 10/18/1975 [id number]  Assessment & Plan:    Encounter Diagnoses  Name Primary?  . Chronic posterior anal fissure Yes  . Hx of adenomatous colonic polyps    I think he has a symptomatic fissure though not much pain I think that is where the bleeding is coming from now.  There is no role to repeat hemorrhoidal ligation that I can tell.  He has quite a bit of spasm there as well.  Treat with topical therapy as below.  Meds ordered this encounter  Medications  . AMBULATORY NON FORMULARY MEDICATION    Sig: Medication Name: Dilitazem 2% mixed with Lidocaine 5% Sig: apply a pea size amount to rectum three times a day    Dispense:  30 g    Refill:  3    Regarding his colon polyp history his wife is concerned and would like him to have a repeat colonoscopy at 3 years not 5.  He had a grandmother with colon cancer.  I explained that 5 years is a reasonable number.  We compromised and we will discuss this at 3 years.    She was asking about a healthy diet.  I think a Mediterranean diet would be a good choice.  He was given a Mediterranean diet instruction sheet and also referred to the med not meds website.   He will return as needed if his symptoms do not resolve.  He is advised to use the diltiazem lidocaine for 1 month after resolution of bleeding.  I appreciate the opportunity to care for this patient. CC: Colon Branch, MD  Subjective:   Chief Complaint: Rectal bleeding history of hemorrhoid banding second opinion establish/transfer care  HPI Martin Pruitt is a 45 year old man here with his wife, after having had a colonoscopy at [facility] for rectal bleeding and constipation that revealed 3 polyps and 2 subcentimeter adenomas in the sigmoid hyperplastic polyp as well as hemorrhoids which were banded.  The note does not tell me how many columns of hemorrhoids were banded though he was recommended to return for repeat banding with sigmoidoscopy.  About  2 weeks after that he had hematochezia that was more with a lot of blood noted and he had syncope.  He went to the emergency department and he had labs where his hemoglobin was normal but he was released or actually might not have been seen.  He went back to [place] on 07/06/2020 in Dr. Eber Jones saw him and noted that he was not having the symptoms but scheduled him for repeat hemorrhoidal ligation with sigmoidoscopy.  After that they decided to seek care elsewhere.  Previous evaluation by Dr. Verlin Fester of allergy and asthma for possible food allergies was negative.  The patient experiencing bloating and flatulence and diarrhea.  He thought cows milk (the patient did) caused acne.  He eliminated milk from his diet and his symptoms improved.  He thinks Lactaid still caused the same symptoms.  A battery of skin tests and serum testing for allergies showed a casein allergy possible on the skin test but was not confirmed by serologic IgE testing so it was not thought that he had true food allergy just intolerance. Allergies  Allergen Reactions  . Msud Aid [Alitraq] Other (See Comments)  . Dairy Aid [Lactase] Other (See Comments)    Lactose Intolerant   Current Meds  Medication Sig  . AMBULATORY NON FORMULARY MEDICATION Medication Name: Dilitazem 2% mixed with Lidocaine 5% Sig: apply a pea  size amount to rectum three times a day  . clobetasol ointment (TEMOVATE) 0.05 % SMARTSIG:1 Topical Every Night  . Omega-3 Fatty Acids (FISH OIL PO) Take 1 tablet by mouth daily.    Past Medical History:  Diagnosis Date  . Colon polyp   . Hx of adenomatous colonic polyps 08/18/2020  . Lactose intolerance    Past Surgical History:  Procedure Laterality Date  . ANTERIOR CRUCIATE LIGAMENT REPAIR     bilaterally   . COLONOSCOPY W/ POLYPECTOMY  05/2020  . ORIF FOREARM FRACTURE Left 2013  . THYROIDECTOMY     partial   Social History   Social History Narrative   Remarried,    2 kids with  second wife    2010    2008   Son from first marriage born [place]   2 alcoholic drinks a day former smoker 2 caffeinated beverages a day no tobacco or drug use now   family history includes Benign prostatic hyperplasia in his father; Colon cancer (age of onset: 45) in his maternal grandmother; Hypertension in his mother.   Review of Systems Skin rash  Objective:   Physical Exam BP 128/80   Pulse 88   Ht 5' 8.5" (1.74 m)   Wt 210 lb (95.3 kg)   BMI 31.47 kg/m  Well-developed well-nourished white man in no acute distress he is overweight  Rectal exam reveals normal anoderm.  There is a fair amount of anal stenosis and spasm.  No mass.  Anoscopic exam is performed and demonstrates a posterior anal fissure and grade 1 internal hemorrhoids without stigmata of bleeding

## 2020-08-18 NOTE — Patient Instructions (Addendum)
Try a mediterranean diet.  Check out this website.  [email or web address].com/  You can quit fish oil I think - waste of money.  Your provider has prescribed Diltiazem/Lidocane gel for you. Please follow the directions written on your prescription bottle or given to you specifically by your provider. Since this is a specialty medication and is not readily available at most local pharmacies, we have sent your prescription to:  [facility] information is below: Address: [address] [place], [place] [address]  [phone]  *Please DO NOT go directly from our office to pick up this medication! Give the pharmacy 1 day to process the prescription as this is compounded and takes time to make.  USE THE MEDICINE A MONTH AWAY ALL YOUR SIGNS AND SYMPTOMS GO AWAY. WE PUT REFILLS ON IT.  We will place a colonoscopy recall for 05/2023 into our system.  I appreciate the opportunity to care for you. Silvano Rusk, MD, [facility]

## 2020-09-07 ENCOUNTER — Encounter: Payer: Self-pay | Admitting: Internal Medicine

## 2020-11-03 ENCOUNTER — Ambulatory Visit: Payer: BC Managed Care – PPO | Admitting: Orthopaedic Surgery

## 2021-03-25 ENCOUNTER — Encounter: Payer: BC Managed Care – PPO | Admitting: Internal Medicine

## 2021-04-07 ENCOUNTER — Other Ambulatory Visit: Payer: Self-pay

## 2021-04-07 ENCOUNTER — Ambulatory Visit: Payer: BC Managed Care – PPO | Admitting: Plastic Surgery

## 2021-04-07 DIAGNOSIS — D2339 Other benign neoplasm of skin of other parts of face: Secondary | ICD-10-CM

## 2021-04-07 NOTE — Progress Notes (Signed)
   Referring Provider Colon Branch, MD [address] [place],  [place] [address]   CC:  Chief Complaint  Patient presents with   Follow-up      Martin Pruitt is an 45 y.o. male.  HPI: Patient presents to discuss a skin lesion adjacent to an area where I had previously excised a cyst.  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  He believes an adjacent area of inflammation has been present since shortly after that procedure.  His previous cyst was directly midline in the glabella and the new area is right adjacent to the medial aspect of his eyebrow.  He is try to be patient to allow it to subside but it has not.  Review of Systems General: Denies fevers and chills  Physical Exam Vitals with BMI 08/18/2020 07/02/2020 04/05/2020  Height 5' 8.5" - -  Weight 210 lbs - -  BMI 77.93 - -  Systolic 903 009 233  Diastolic 80 69 88  Pulse 88 100 73    General:  No acute distress,  Alert and oriented, Non-Toxic, Normal speech and affect Examination shows a 6-7 mm ulcerative skin lesion just medial to the eyebrow on the right side.  No other surrounding skin changes.  Scar in the midline is well-healed.  Assessment/Plan Patient has what looks to me like a new lesion at a separate site from the central glabellar cyst that was excised previously.  I think it is reasonable to excise this area as well.  We reviewed the risks and benefits he is fully understanding.  We will plan to get it scheduled for him.  Cindra Presume 04/07/2021, 3:25 PM

## 2021-04-18 ENCOUNTER — Other Ambulatory Visit: Payer: Self-pay

## 2021-04-18 ENCOUNTER — Other Ambulatory Visit (HOSPITAL_COMMUNITY)
Admission: RE | Admit: 2021-04-18 | Discharge: 2021-04-18 | Disposition: A | Discharge status: Home / Self Care | Payer: BC Managed Care – PPO | Source: Ambulatory Visit | Attending: Plastic Surgery | Admitting: Plastic Surgery

## 2021-04-18 ENCOUNTER — Ambulatory Visit: Payer: BC Managed Care – PPO | Admitting: Plastic Surgery

## 2021-04-18 VITALS — BP 131/78 | HR 78

## 2021-04-18 DIAGNOSIS — L989 Disorder of the skin and subcutaneous tissue, unspecified: Secondary | ICD-10-CM | POA: Diagnosis not present

## 2021-04-18 DIAGNOSIS — L281 Prurigo nodularis: Secondary | ICD-10-CM | POA: Diagnosis not present

## 2021-04-18 NOTE — Progress Notes (Signed)
Operative Note   DATE OF OPERATION: 04/18/2021  LOCATION:    SURGICAL DEPARTMENT: Plastic Surgery  PREOPERATIVE DIAGNOSES: Right glabellar cyst  POSTOPERATIVE DIAGNOSES:  same  PROCEDURE:  Excision of right glabellar cyst measuring 2 cm Complex closure measuring 2 cm  SURGEON: Talmadge Coventry, MD  ANESTHESIA:  Local  COMPLICATIONS: None.   INDICATIONS FOR PROCEDURE:  The patient, Martin Pruitt is a 45 y.o. male born on 1975-07-12, is here for treatment of right glabellar cyst MRN: [id number]  CONSENT:  Informed consent was obtained directly from the patient. Risks, benefits and alternatives were fully discussed. Specific risks including but not limited to bleeding, infection, hematoma, seroma, scarring, pain, infection, wound healing problems, and need for further surgery were all discussed. The patient did have an ample opportunity to have questions answered to satisfaction.   DESCRIPTION OF PROCEDURE:  Local anesthesia was administered. The patient's operative site was prepped and draped in a sterile fashion. A time out was performed and all information was confirmed to be correct.  The lesion was excised with a 15 blade.  Hemostasis was obtained.  Circumferential undermining was performed and the skin was advanced and closed in layers with interrupted buried Monocryl sutures and 5-0 fast gut for the skin.  The lesion excised measured 2 cm, and the total length of closure measured 2 cm.    The patient tolerated the procedure well.  There were no complications.

## 2021-04-27 ENCOUNTER — Other Ambulatory Visit: Payer: Self-pay

## 2021-04-27 ENCOUNTER — Ambulatory Visit (INDEPENDENT_AMBULATORY_CARE_PROVIDER_SITE_OTHER): Payer: BC Managed Care – PPO | Admitting: Plastic Surgery

## 2021-04-27 DIAGNOSIS — L989 Disorder of the skin and subcutaneous tissue, unspecified: Secondary | ICD-10-CM

## 2021-04-27 NOTE — Progress Notes (Signed)
Patient presents about a week postop from glabellar cyst excision.  Overall he feels like things are healing well.  On exam looks to have healed nicely.  There is a smooth contour.  All the sutures have dissolved.  His pathology results have not been posted so we will follow-up on this to make sure there were not any issues.  Otherwise plan to call him with those results.  All of his questions were answered.

## 2021-04-28 LAB — SURGICAL PATHOLOGY

## 2021-05-02 ENCOUNTER — Encounter: Payer: Self-pay | Admitting: Internal Medicine

## 2021-05-02 ENCOUNTER — Ambulatory Visit (INDEPENDENT_AMBULATORY_CARE_PROVIDER_SITE_OTHER): Payer: BC Managed Care – PPO | Admitting: Internal Medicine

## 2021-05-02 VITALS — BP 116/72 | HR 68 | Temp 98.1°F | Resp 16 | Ht 69.0 in | Wt 216.0 lb

## 2021-05-02 DIAGNOSIS — Z0000 Encounter for general adult medical examination without abnormal findings: Secondary | ICD-10-CM

## 2021-05-02 NOTE — Patient Instructions (Signed)
   GO TO THE LAB : Get the blood work     GO TO THE FRONT DESK, PLEASE SCHEDULE YOUR APPOINTMENTS Come back for a physical exam in 1 year 

## 2021-05-02 NOTE — Progress Notes (Signed)
Subjective:    Patient ID: Martin Pruitt, male    DOB: 1976/02/28, 45 y.o.   MRN: [id number]  DOS:  05/02/2021 Type of visit - description: CPX  Here for CPX, no major concerns. Lifestyle is very good  Review of Systems   A 14 point review of systems is negative    Past Medical History:  Diagnosis Date   Colon polyp    Hx of adenomatous colonic polyps 08/18/2020   Lactose intolerance     Past Surgical History:  Procedure Laterality Date   ANTERIOR CRUCIATE LIGAMENT REPAIR     bilaterally    COLONOSCOPY W/ POLYPECTOMY  05/2020   ORIF FOREARM FRACTURE Left 2013   THYROIDECTOMY     partial   Social History   Socioeconomic History   Marital status: Married    Spouse name: Not on file   Number of children: 3   Years of education: Not on file   Highest education level: Not on file  Occupational History   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[employer]    Employer: [employer]  Tobacco Use   Smoking status: Some Days    Types: Cigarettes   Smokeless tobacco: Current   Tobacco comments:    1/6 ppd  Vaping Use   Vaping Use: Some days  Substance and Sexual Activity   Alcohol use: Yes    Alcohol/week: 0.0 standard drinks    Comment: 2 drinks daily   Drug use: Yes    Types: Marijuana   Sexual activity: Yes  Other Topics Concern   Not on file  Social History Narrative   Remarried,    2 kids with  second wife    2010   2008   Son from first marriage born [place]   2 alcoholic drinks a day , social smoker 2 caffeinated beverages a day no tobacco or drug use now   Social Determinants of Radio broadcast assistant Strain: Not on file  Food Insecurity: Not on file  Transportation Needs: Not on file  Physical Activity: Not on file  Stress: Not on file  Social Connections: Not on file  Intimate Partner Violence: Not on file    Allergies as of 05/02/2021       Reactions   Msud Aid [alitraq] Other (See Comments)   Dairy Aid [tilactase] Other (See Comments)    Lactose Intolerant        Medication List        Accurate as of May 02, 2021 11:59 PM. If you have any questions, ask your nurse or doctor.          AMBULATORY NON FORMULARY MEDICATION Medication Name: Dilitazem 2% mixed with Lidocaine 5% Sig: apply a pea size amount to rectum three times a day   clobetasol ointment 0.05 % Commonly known as: TEMOVATE SMARTSIG:1 Topical Every Night   FISH OIL PO Take 1 tablet by mouth daily.           Objective:   Physical Exam BP 116/72 (BP Location: Left Arm, Patient Position: Sitting, Cuff Size: Normal)   Pulse 68   Temp 98.1 F (36.7 C) (Oral)   Resp 16   Ht 5\' 9"  (1.753 m)   Wt 216 lb (98 kg)   SpO2 98%   BMI 31.90 kg/m  General: Well developed, NAD, BMI noted Neck: No  thyromegaly  HEENT:  Normocephalic . Face symmetric, atraumatic Lungs:  CTA B Normal respiratory effort, no intercostal retractions, no  accessory muscle use. Heart: RRR,  no murmur.  Abdomen:  Not distended, soft, non-tender. No rebound or rigidity.   Lower extremities: no pretibial edema bilaterally  Skin: Upper left back, has stooled 3 superficial round lesions, not hyperpigmented, acute appearing. Neurologic:  alert & oriented X3.  Speech normal, gait appropriate for age and unassisted Strength symmetric and appropriate for age.  Psych: Cognition and judgment appear intact.  Cooperative with normal attention span and concentration.  Behavior appropriate. No anxious or depressed appearing.     Assessment     Assessment Partial thyroidectomy  PLAN Here for CPX Skin lesions: Has on and off skin lesions mainly at the upper back, see physical exam, findings are not specific, recommend to let me know if he has any lesion that is persistent, bleeding, hyperpigmented.  States that wife can check that area frequently RTC 1 year    This visit occurred during the SARS-CoV-2 public health emergency.  Safety protocols were in place,  including screening questions prior to the visit, additional usage of staff PPE, and extensive cleaning of exam room while observing appropriate contact time as indicated for disinfecting solutions.

## 2021-05-03 ENCOUNTER — Ambulatory Visit: Payer: BC Managed Care – PPO | Admitting: Surgical

## 2021-05-03 ENCOUNTER — Encounter: Payer: Self-pay | Admitting: Internal Medicine

## 2021-05-03 LAB — COMPREHENSIVE METABOLIC PANEL
ALT: 17 U/L (ref 0–53)
AST: 20 U/L (ref 0–37)
Albumin: 4.3 g/dL (ref 3.5–5.2)
Alkaline Phosphatase: 46 U/L (ref 39–117)
BUN: 18 mg/dL (ref 6–23)
CO2: 27 mEq/L (ref 19–32)
Calcium: 9.2 mg/dL (ref 8.4–10.5)
Chloride: 104 mEq/L (ref 96–112)
Creatinine, Ser: 1.21 mg/dL (ref 0.40–1.50)
GFR: 72.3 mL/min (ref >60.00)
Glucose, Bld: 83 mg/dL (ref 70–99)
Potassium: 4.1 mEq/L (ref 3.5–5.1)
Sodium: 139 mEq/L (ref 135–145)
Total Bilirubin: 0.5 mg/dL (ref 0.2–1.2)
Total Protein: 6.3 g/dL (ref 6.0–8.3)

## 2021-05-03 LAB — CBC WITH DIFFERENTIAL/PLATELET
Basophils Absolute: 0.1 10*3/uL (ref 0.0–0.1)
Basophils Relative: 0.9 % (ref 0.0–3.0)
Eosinophils Absolute: 0.1 10*3/uL (ref 0.0–0.7)
Eosinophils Relative: 2.1 % (ref 0.0–5.0)
HCT: 42.7 % (ref 39.0–52.0)
Hemoglobin: 14.4 g/dL (ref 13.0–17.0)
Lymphocytes Relative: 25 % (ref 12.0–46.0)
Lymphs Abs: 1.6 10*3/uL (ref 0.7–4.0)
MCHC: 33.7 g/dL (ref 30.0–36.0)
MCV: 86.7 fl (ref 78.0–100.0)
Monocytes Absolute: 0.4 10*3/uL (ref 0.1–1.0)
Monocytes Relative: 6.6 % (ref 3.0–12.0)
Neutro Abs: 4.2 10*3/uL (ref 1.4–7.7)
Neutrophils Relative %: 65.4 % (ref 43.0–77.0)
Platelets: 178 10*3/uL (ref 150.0–400.0)
RBC: 4.93 Mil/uL (ref 4.22–5.81)
RDW: 13.2 % (ref 11.5–15.5)
WBC: 6.5 10*3/uL (ref 4.0–10.5)

## 2021-05-03 LAB — LIPID PANEL
Cholesterol: 178 mg/dL (ref 0–200)
HDL: 40.3 mg/dL (ref >39.00)
NonHDL: 137.53
Total CHOL/HDL Ratio: 4
Triglycerides: 304 mg/dL — ABNORMAL HIGH (ref 0.0–149.0)
VLDL: 60.8 mg/dL — ABNORMAL HIGH (ref 0.0–40.0)

## 2021-05-03 LAB — LDL CHOLESTEROL, DIRECT: Direct LDL: 102 mg/dL

## 2021-05-03 NOTE — Assessment & Plan Note (Signed)
-  Td 01-2015 -  covid vax, bivalent booster rec - flu shot declines. -CCS: C-scope 06/18/2020 at [facility], + polyps, next 5 years per pathology report.  . -Prostate cancer screening: Not indicated -Diet: Doing well, healthy.  Exercise: Typically 5 times a week. -Patient still smokes, less than before.  Counseled -Labs reviewed: Check CMP, FLP, CBC

## 2021-05-03 NOTE — Assessment & Plan Note (Signed)
Here for CPX Skin lesions: Has on and off skin lesions mainly at the upper back, see physical exam, findings are not specific, recommend to let me know if he has any lesion that is persistent, bleeding, hyperpigmented.  States that wife can check that area frequently RTC 1 year

## 2022-05-03 ENCOUNTER — Encounter: Payer: BC Managed Care – PPO | Admitting: Internal Medicine

## 2022-05-08 ENCOUNTER — Telehealth: Payer: Self-pay | Admitting: Internal Medicine

## 2022-05-08 DIAGNOSIS — Z0000 Encounter for general adult medical examination without abnormal findings: Secondary | ICD-10-CM

## 2022-05-08 NOTE — Telephone Encounter (Signed)
Orders placed. Please schedule lab appt at his convenience.

## 2022-05-08 NOTE — Telephone Encounter (Signed)
CMP FLP CBC TSH

## 2022-05-08 NOTE — Telephone Encounter (Signed)
Patient's wife stopped by the office to request her husbands lab be done before his CPE appt on 05/12/22 so they can discuss the labs that day. Please advise.

## 2022-05-08 NOTE — Telephone Encounter (Signed)
Please advise 

## 2022-05-09 NOTE — Telephone Encounter (Signed)
LVM to call back and schedule lab appt.

## 2022-05-10 ENCOUNTER — Other Ambulatory Visit (INDEPENDENT_AMBULATORY_CARE_PROVIDER_SITE_OTHER): Payer: BC Managed Care – PPO

## 2022-05-10 DIAGNOSIS — Z0000 Encounter for general adult medical examination without abnormal findings: Secondary | ICD-10-CM | POA: Diagnosis not present

## 2022-05-10 LAB — COMPREHENSIVE METABOLIC PANEL
ALT: 22 U/L (ref 0–53)
AST: 29 U/L (ref 0–37)
Albumin: 4.9 g/dL (ref 3.5–5.2)
Alkaline Phosphatase: 47 U/L (ref 39–117)
BUN: 14 mg/dL (ref 6–23)
CO2: 28 mEq/L (ref 19–32)
Calcium: 9.5 mg/dL (ref 8.4–10.5)
Chloride: 103 mEq/L (ref 96–112)
Creatinine, Ser: 1.23 mg/dL (ref 0.40–1.50)
GFR: 70.38 mL/min (ref >60.00)
Glucose, Bld: 87 mg/dL (ref 70–99)
Potassium: 4.2 mEq/L (ref 3.5–5.1)
Sodium: 141 mEq/L (ref 135–145)
Total Bilirubin: 0.6 mg/dL (ref 0.2–1.2)
Total Protein: 7.1 g/dL (ref 6.0–8.3)

## 2022-05-10 LAB — LIPID PANEL
Cholesterol: 173 mg/dL (ref 0–200)
HDL: 41.6 mg/dL (ref >39.00)
NonHDL: 131.55
Total CHOL/HDL Ratio: 4
Triglycerides: 226 mg/dL — ABNORMAL HIGH (ref 0.0–149.0)
VLDL: 45.2 mg/dL — ABNORMAL HIGH (ref 0.0–40.0)

## 2022-05-10 LAB — CBC WITH DIFFERENTIAL/PLATELET
Basophils Absolute: 0 10*3/uL (ref 0.0–0.1)
Basophils Relative: 0.6 % (ref 0.0–3.0)
Eosinophils Absolute: 0.1 10*3/uL (ref 0.0–0.7)
Eosinophils Relative: 2.2 % (ref 0.0–5.0)
HCT: 46.5 % (ref 39.0–52.0)
Hemoglobin: 16.1 g/dL (ref 13.0–17.0)
Lymphocytes Relative: 36.6 % (ref 12.0–46.0)
Lymphs Abs: 1.8 10*3/uL (ref 0.7–4.0)
MCHC: 34.6 g/dL (ref 30.0–36.0)
MCV: 86.6 fl (ref 78.0–100.0)
Monocytes Absolute: 0.4 10*3/uL (ref 0.1–1.0)
Monocytes Relative: 7.6 % (ref 3.0–12.0)
Neutro Abs: 2.6 10*3/uL (ref 1.4–7.7)
Neutrophils Relative %: 53 % (ref 43.0–77.0)
Platelets: 195 10*3/uL (ref 150.0–400.0)
RBC: 5.37 Mil/uL (ref 4.22–5.81)
RDW: 13.2 % (ref 11.5–15.5)
WBC: 4.9 10*3/uL (ref 4.0–10.5)

## 2022-05-10 LAB — TSH: TSH: 2.24 u[IU]/mL (ref 0.35–5.50)

## 2022-05-10 LAB — LDL CHOLESTEROL, DIRECT: Direct LDL: 91 mg/dL

## 2022-05-12 ENCOUNTER — Encounter: Payer: Self-pay | Admitting: Internal Medicine

## 2022-05-12 ENCOUNTER — Ambulatory Visit (INDEPENDENT_AMBULATORY_CARE_PROVIDER_SITE_OTHER): Payer: BC Managed Care – PPO | Admitting: Internal Medicine

## 2022-05-12 VITALS — BP 126/84 | HR 61 | Temp 98.3°F | Resp 16 | Ht 69.0 in | Wt 202.5 lb

## 2022-05-12 DIAGNOSIS — Z0000 Encounter for general adult medical examination without abnormal findings: Secondary | ICD-10-CM

## 2022-05-12 NOTE — Progress Notes (Signed)
Subjective:    Patient ID: Martin Pruitt, male    DOB: 04-Feb-1976, 46 y.o.   MRN: [id number]  DOS:  05/12/2022 Type of visit - description: CPX  Here for CPX. He reports that  rarely has stomach discomfort, he could not tell me the location. No associated nausea or vomiting. He also occasionally sees blood in the toilet paper but not in the stools. He is not concerned about above but his wife is.  Occasionally has "the skin irritation" related to scratching from itching.  Wt Readings from Last 3 Encounters:  05/12/22 202 lb 8 oz (91.9 kg)  05/02/21 216 lb (98 kg)  08/18/20 210 lb (95.3 kg)    Review of Systems  Other than above, a 14 point review of systems is negative   Past Medical History:  Diagnosis Date   Colon polyp    Hx of adenomatous colonic polyps 08/18/2020   Lactose intolerance     Past Surgical History:  Procedure Laterality Date   ANTERIOR CRUCIATE LIGAMENT REPAIR     bilaterally    COLONOSCOPY W/ POLYPECTOMY  05/2020   ORIF FOREARM FRACTURE Left 2013   THYROIDECTOMY     partial   Social History   Socioeconomic History   Marital status: Married    Spouse name: Not on file   Number of children: 3   Years of education: Not on file   Highest education level: Not on file  Occupational History   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[employer]    Employer: [employer]  Tobacco Use   Smoking status: Some Days    Types: Cigarettes   Smokeless tobacco: Current   Tobacco comments:    1/6 ppd  Vaping Use   Vaping Use: Some days  Substance and Sexual Activity   Alcohol use: Yes    Alcohol/week: 0.0 standard drinks of alcohol    Comment: 2 drinks daily   Drug use: Yes    Types: Marijuana   Sexual activity: Yes  Other Topics Concern   Not on file  Social History Narrative   Remarried,    2 kids with  second wife    2010   2008   Son from first marriage born 1997      Engineer [employer]    alcoholic drinks : not daily, social   social smoker    2 caffeinated  beverages a day no tobacco or drug use now   Social Determinants of Corporate investment banker Strain: Not on file  Food Insecurity: Not on file  Transportation Needs: Not on file  Physical Activity: Not on file  Stress: Not on file  Social Connections: Not on file  Intimate Partner Violence: Not on file    No current outpatient medications      Objective:   Physical Exam BP 126/84   Pulse 61   Temp 98.3 F (36.8 C) (Oral)   Resp 16   Ht 5\' 9"  (1.753 m)   Wt 202 lb 8 oz (91.9 kg)   SpO2 97%   BMI 29.90 kg/m  General: Well developed, NAD, BMI noted Neck: No  thyromegaly  HEENT:  Normocephalic . Face symmetric, atraumatic Lungs:  CTA B Normal respiratory effort, no intercostal retractions, no accessory muscle use. Heart: RRR,  no murmur.  Abdomen:  Not distended, soft, non-tender. No rebound or rigidity.   Lower extremities: no pretibial edema bilaterally  Skin: At the mid back, has an area of nonspecific erythema apparently from some scratching Neurologic:  alert &  oriented X3.  Speech normal, gait appropriate for age and unassisted Strength symmetric and appropriate for age.  Psych: Cognition and judgment appear intact.  Cooperative with normal attention span and concentration.  Behavior appropriate. No anxious or depressed appearing.     Assessment     Assessment Partial thyroidectomy  PLAN Here for CPX GI symptoms: Symptoms are very vague, had a colonoscopy in 2022, CBC and LFTs normal.  Recommend observation but if he remains concerned about them needs to see GI "Skin irritation".  Nonspecific symptoms, he itches on and off at different places.  Recommend Allegra for 2 weeks see if that decreases symptoms confirming this is an allergy otherwise see dermatology RTC 1 year

## 2022-05-12 NOTE — Patient Instructions (Addendum)
Skin irritation: Allegra 60 mg twice daily for 2 weeks, see if that helps you.  Otherwise see your dermatologist  If you continue to be worried about your stomach symptoms, see your gastroenterologist  Vaccines I recommend:  Covid booster Flu shot     GO TO [place], [place] back for   physical exam in 1 year

## 2022-05-13 ENCOUNTER — Encounter: Payer: Self-pay | Admitting: Internal Medicine

## 2022-05-13 NOTE — Assessment & Plan Note (Signed)
Here for CPX GI symptoms: Symptoms are very vague, had a colonoscopy in 2022, CBC and LFTs normal.  Recommend observation but if he remains concerned about them needs to see GI "Skin irritation".  Nonspecific symptoms, he itches on and off at different places.  Recommend Allegra for 2 weeks see if that decreases symptoms confirming this is an allergy otherwise see dermatology RTC 1 year

## 2022-05-13 NOTE — Assessment & Plan Note (Signed)
-  Td 01-2015 -  covid vax, flu shot recommended  -CCS: C-scope 06/18/2020 at [facility], + polyps, next 5 years per pathology report.  . -Prostate cancer screening: Not indicated -Diet- exercise: doing better, + wt loss, praised  -Patient still smokes, states very little, not daily.  Counseled -Labs reviewed

## 2022-06-09 ENCOUNTER — Encounter: Payer: BC Managed Care – PPO | Admitting: Internal Medicine

## 2022-07-05 DIAGNOSIS — L719 Rosacea, unspecified: Secondary | ICD-10-CM | POA: Diagnosis not present

## 2022-07-05 DIAGNOSIS — L281 Prurigo nodularis: Secondary | ICD-10-CM | POA: Diagnosis not present

## 2022-09-05 DIAGNOSIS — L719 Rosacea, unspecified: Secondary | ICD-10-CM | POA: Diagnosis not present

## 2022-09-05 DIAGNOSIS — L282 Other prurigo: Secondary | ICD-10-CM | POA: Diagnosis not present

## 2022-09-05 DIAGNOSIS — L281 Prurigo nodularis: Secondary | ICD-10-CM | POA: Diagnosis not present

## 2022-09-26 ENCOUNTER — Telehealth: Payer: Self-pay | Admitting: Internal Medicine

## 2022-09-26 NOTE — Telephone Encounter (Signed)
Ok w/ me, thanks  

## 2022-09-26 NOTE — Telephone Encounter (Signed)
Pt would like to transfer to [facility] due to being closer to our office. Please call spouse to once answer is received. Please advise.

## 2022-10-05 NOTE — Telephone Encounter (Signed)
Still waiting to hear from Dr Jon Billings if he will accept pt as a TOC.

## 2022-10-12 NOTE — Telephone Encounter (Signed)
LVM to schedule TOC.

## 2022-12-24 DIAGNOSIS — L0109 Other impetigo: Secondary | ICD-10-CM | POA: Diagnosis not present

## 2022-12-24 DIAGNOSIS — T25211A Burn of second degree of right ankle, initial encounter: Secondary | ICD-10-CM | POA: Diagnosis not present

## 2023-05-18 ENCOUNTER — Ambulatory Visit (INDEPENDENT_AMBULATORY_CARE_PROVIDER_SITE_OTHER): Payer: BC Managed Care – PPO | Admitting: Internal Medicine

## 2023-05-18 ENCOUNTER — Encounter: Payer: BC Managed Care – PPO | Admitting: Internal Medicine

## 2023-05-18 ENCOUNTER — Encounter: Payer: Self-pay | Admitting: Internal Medicine

## 2023-05-18 VITALS — BP 116/88 | HR 75 | Temp 97.7°F | Ht 69.0 in | Wt 197.0 lb

## 2023-05-18 DIAGNOSIS — Z0000 Encounter for general adult medical examination without abnormal findings: Secondary | ICD-10-CM | POA: Diagnosis not present

## 2023-05-18 DIAGNOSIS — E65 Localized adiposity: Secondary | ICD-10-CM | POA: Diagnosis not present

## 2023-05-18 DIAGNOSIS — B354 Tinea corporis: Secondary | ICD-10-CM | POA: Diagnosis not present

## 2023-05-18 DIAGNOSIS — K089 Disorder of teeth and supporting structures, unspecified: Secondary | ICD-10-CM

## 2023-05-18 DIAGNOSIS — L989 Disorder of the skin and subcutaneous tissue, unspecified: Secondary | ICD-10-CM | POA: Insufficient documentation

## 2023-05-18 LAB — COMPREHENSIVE METABOLIC PANEL
ALT: 17 U/L (ref 0–53)
AST: 24 U/L (ref 0–37)
Albumin: 4.5 g/dL (ref 3.5–5.2)
Alkaline Phosphatase: 45 U/L (ref 39–117)
BUN: 18 mg/dL (ref 6–23)
CO2: 28 meq/L (ref 19–32)
Calcium: 8.9 mg/dL (ref 8.4–10.5)
Chloride: 103 meq/L (ref 96–112)
Creatinine, Ser: 1.18 mg/dL (ref 0.40–1.50)
GFR: 73.45 mL/min (ref >60.00)
Glucose, Bld: 95 mg/dL (ref 70–99)
Potassium: 3.9 meq/L (ref 3.5–5.1)
Sodium: 139 meq/L (ref 135–145)
Total Bilirubin: 0.7 mg/dL (ref 0.2–1.2)
Total Protein: 6.7 g/dL (ref 6.0–8.3)

## 2023-05-18 LAB — CBC WITH DIFFERENTIAL/PLATELET
Basophils Absolute: 0 10*3/uL (ref 0.0–0.1)
Basophils Relative: 0.6 % (ref 0.0–3.0)
Eosinophils Absolute: 0.1 10*3/uL (ref 0.0–0.7)
Eosinophils Relative: 2.1 % (ref 0.0–5.0)
HCT: 45.5 % (ref 39.0–52.0)
Hemoglobin: 15.3 g/dL (ref 13.0–17.0)
Lymphocytes Relative: 27.5 % (ref 12.0–46.0)
Lymphs Abs: 1.6 10*3/uL (ref 0.7–4.0)
MCHC: 33.5 g/dL (ref 30.0–36.0)
MCV: 88.9 fL (ref 78.0–100.0)
Monocytes Absolute: 0.5 10*3/uL (ref 0.1–1.0)
Monocytes Relative: 7.9 % (ref 3.0–12.0)
Neutro Abs: 3.7 10*3/uL (ref 1.4–7.7)
Neutrophils Relative %: 61.9 % (ref 43.0–77.0)
Platelets: 173 10*3/uL (ref 150.0–400.0)
RBC: 5.12 Mil/uL (ref 4.22–5.81)
RDW: 13.1 % (ref 11.5–15.5)
WBC: 6 10*3/uL (ref 4.0–10.5)

## 2023-05-18 LAB — HEMOGLOBIN A1C: Hgb A1c MFr Bld: 5.5 % (ref 4.6–6.5)

## 2023-05-18 LAB — LIPID PANEL
Cholesterol: 162 mg/dL (ref 0–200)
HDL: 42.5 mg/dL (ref >39.00)
LDL Cholesterol: 77 mg/dL (ref 0–99)
NonHDL: 119.92
Total CHOL/HDL Ratio: 4
Triglycerides: 216 mg/dL — ABNORMAL HIGH (ref 0.0–149.0)
VLDL: 43.2 mg/dL — ABNORMAL HIGH (ref 0.0–40.0)

## 2023-05-18 LAB — TSH: TSH: 2.39 u[IU]/mL (ref 0.35–5.50)

## 2023-05-18 NOTE — Assessment & Plan Note (Signed)
Central Adiposity   He exhibits central adiposity, likely due to dietary habits. We discussed the importance of a heart-healthy diet and reducing carbohydrate intake. An A1c test to check for diabetes has been ordered. We recommend reducing carbohydrate intake and incorporating heart-healthy foods such as fish, nuts, extra virgin olive oil, and avocados into his diet.Grace Isaac

## 2023-05-18 NOTE — Progress Notes (Signed)
[facility]: [phone]   -- Annual Preventive Medical Office Visit --  Patient:  Martin Pruitt      Age: 47 y.o.       Sex:  male  Date:   05/18/2023 Patient Care Team: Lula Olszewski, MD as PCP - General (Internal Medicine) Iva Boop, MD as Consulting Physician (Gastroenterology) Today's Healthcare Provider: Lula Olszewski, MD  ========================================= Chief Complaint  Patient presents with   Transfer of care   Sore on middle of chest    Sore and tender to the touch, saw Dermatology and the cream he was given did not help.   Annual Exam    Purpose of Visit: Comprehensive preventive health assessment and personalized health maintenance planning.  This encounter was conducted as a Comprehensive Physical Exam (CPE) preventive care annual visit. The patient's medical history and problem list were reviewed to inform individualized preventive care recommendations.   No problem-specific medical treatment was provided during this visit.    Assessment & Plan Encounter for annual health examination Annual Physical Exam   During his routine annual physical exam, no significant abnormalities were found. He exercises regularly and maintains a reasonable diet, though specific dietary adjustments could offer further benefits. We discussed the importance of regular eye exams, reducing alcohol and CBD use, and the risks associated with motorcycle riding. We will perform routine lab work, document physical exam findings, recommend 150 minutes of exercise per week, and advise adding heart-healthy foods to his diet. Screening for sleep apnea, advising on reducing alcohol and CBD use, and discussing motorcycle riding risks with recommendations for safer alternatives were also covered. Annual physical exam  Skin lesion Skin Lesions   He has persistent skin lesions on the chest and shoulder for at least a year. A referral to dermatology  for further evaluation and management has been made, considering a differential diagnosis of chronic skin conditions. We will take photographs of the lesions for the dermatology referral. Central adiposity Central Adiposity   He exhibits central adiposity, likely due to dietary habits. We discussed the importance of a heart-healthy diet and reducing carbohydrate intake. An A1c test to check for diabetes has been ordered. We recommend reducing carbohydrate intake and incorporating heart-healthy foods such as fish, nuts, extra virgin olive oil, and avocados into his diet.Grace Isaac Tinea corporis Tinea Corporis (Ringworm)   He presents with multiple skin sores likely due to ringworm, with a history of household exposure from a cat. We discussed the importance of consistent antifungal treatment for 3-6 months and treating all affected household members and pets to prevent recurrence. We recommend over-the-counter antifungal cream, such as clotrimazole, and advise consistent use for 3-6 months, emphasizing the treatment of all affected household members and pets. Poor dentition Dental Health   He has significant plaque buildup and is overdue for a dental cleaning. We discussed the importance of regular dental visits to prevent tooth loss and expensive treatments. We advise scheduling a dental cleaning and check-up as soon as possible and recommend regular dental visits every 6 months. Follow-up   We will schedule an annual follow-up visit and follow up on lab results via MyChart. Ensuring the completion of the dermatology referral and advising on scheduling dental and eye exams are also planned.  Diagnoses and all orders for this visit: Encounter for annual health examination -     CBC w/Diff -     Comp Met (CMET) -     Lipid panel -  TSH -     HgB A1c Annual physical exam Skin lesion -     Ambulatory referral to Dermatology Central adiposity Tinea corporis Poor dentition  Today's Health  Maintenance Counseling and Anticipatory Guidance:  Eye exams:  We encouraged patient to to complete eye exam every 1-2 years. Dental health:  He reports he has not been keeping up with his dental visits.  He intends to do so in the future.  We encourage regular tooth brushing/flossing. Sinus health:  We encouraged sterile saline nasal misting sinus rinses daily for pollen, to reduce allergies and risk for sinus infections.   Sterile can based misting products are recommended due to superior misting and ease of maintaining sterility Sleep Apnea screening:  We encourage self monitoring of sleep quality with SnoreLab App and other tools/apps that are now available at retail []  Do you snore loudly []  Tired, fatigued, or sleepy during the daytime []  Witness apneas []  Significant hypertension []  BMI greater than 35    []  Age older than 47 years old [x]  Has large neck size over 15.7 in [x]  Male Total Score: 2  "I don't have that" but do stay up to midnight Cardiovascular Risk Factor Reduction:   Advised patient of need for regular exercise and diet rich and fruits and vegetables and healthy fats to reduce risk of heart attack and stroke.  Avoid first- and second-hand smoke and stimulants.   Avoid extreme exercise- exercise in moderation (150 minutes per week is a good goal) Discussed health benefits of physical activity, and encouraged him to engage in regular exercise appropriate for his age and condition. Exercise Activities and Dietary recommendations  Goals   None    Wt Readings from Last 3 Encounters:  05/18/23 197 lb (89.4 kg)  05/12/22 202 lb 8 oz (91.9 kg)  05/02/21 216 lb (98 kg)  Body mass index is 29.09 kg/m. Health maintenance and immunizations reviewed and he was encouraged to complete anything that is due: Immunization History  Administered Date(s) Administered   PFIZER(Purple Top)SARS-COV-2 Vaccination 10/11/2019, 11/01/2019   Tdap 02/10/2015   There are no preventive care  reminders to display for this patient.  Health Maintenance  Topic Date Due   INFLUENZA VACCINE  08/27/2023 (Originally 12/28/2022)   DTaP/Tdap/Td (2 - Td or Tdap) 02/09/2025   Colonoscopy  06/18/2025   Hepatitis C Screening  Completed   HIV Screening  Completed   HPV VACCINES  Aged Out   COVID-19 Vaccine  Discontinued  Lifestyle / Social History Reviewed:  Social History   Tobacco Use   Smoking status: Some Days    Types: Cigarettes   Smokeless tobacco: Current   Tobacco comments:    1/6 ppd  Vaping Use   Vaping status: Some Days  Substance Use Topics   Alcohol use: Yes    Alcohol/week: 0.0 standard drinks of alcohol    Comment: 2 drinks daily   Drug use: Yes    Types: Marijuana   My recommendation is total abstinence from all substances of abuse including smoke and 2nd hand smoke, alcohol, illicit drugs, smoking, inhalants, sugar, high risk sexual behavior Offered to assist with any use disorders or addictions.   Sexual transmitted infection testing offered today, but patient declined as he feels he is low risk based on his sexual history.   Social History   Substance and Sexual Activity  Sexual Activity Yes      05/18/2023    8:21 AM  Depression screen PHQ 2/9  Decreased Interest  0  Down, Depressed, Hopeless 0  PHQ - 2 Score 0  Injury prevention: Discussed not texting and driving.  Today's Cancer Screening  Penile/Testicle/Scrotum cancer screening: Asked the patient to self-monitor for genital tumors. Patient reports there are none. Thyroid cancer screening: Advised to check by palpating thyroid for nodules normal check today Prostate cancer screening:  Denies family history of prostate cancer or hematospermia so too young for screening by current guidelines.(No results found for: "PSA") Colon cancer screening:   prior colonoscopy showed polyps so repeat due 2027 Lung cancer screening:  Current guidelines recommend Individuals aged 29 to 51 who currently smoke or  formerly smoked and have a >= 20 pack-year smoking history should undergo annual screening with low-dose computed tomography (LDCT).  He is occasional smoker only Skin cancer screening: Advised regular sunscreen use. He denies worrisome, changing, or new skin lesions. Showed him pictures of melanomas for reference:     Subjective  47 y.o. male presents today for a complete physical exam.   History of Present Illness The patient, an Art gallery manager by profession, presented for a routine physical examination and lab work. He also expressed concern about a few skin sores, which he has noticed for over a year. The patient reported a recent acquisition of a cat, which has been diagnosed with ringworm. The patient and his family have been dealing with ringworm infections since the cat's arrival.  The patient has been proactive about his health, undergoing regular physical examinations. However, he admitted to a lapse in dental care, having missed his dental appointments for the past two to three years. He acknowledged the presence of significant dental plaque and the need for descaling.  Regarding lifestyle, the patient reported daily exercise, either at the gym or outdoor bodyweight exercises, depending on the weather. He described his diet as reasonable but did not provide specific details. He admitted to occasional alcohol consumption and sporadic use of cannabis. The patient also disclosed a habit of staying up late, indicating poor sleep hygiene.  The patient has a history of polyps in the colon, which were removed during a colonoscopy procedure. He also reported a decline in vision, which he has not yet addressed with an eye specialist. The patient rides a motorcycle, which he acknowledged as a risky behavior.  In summary, the patient presented for a routine physical examination with concerns about skin sores and a history of ringworm infection. He has been proactive about his physical health but has  neglected his dental and eye health. The patient maintains an active lifestyle but has some unhealthy habits, including late-night activities and motorcycle riding.  Past Medical History - Ringworm - Skin lesions  Social History - Occasional alcohol consumption - Occasional marijuana use - Non-smoker  HPI HPI     Sore on middle of chest    Additional comments: Sore and tender to the touch, saw Dermatology and the cream he was given did not help.      Last edited by Donzetta Starch, CMA on 05/18/2023  8:28 AM.        Review of Systems  Constitutional:  Negative for chills, diaphoresis, fever, malaise/fatigue and weight loss.  HENT:  Negative for congestion, ear discharge, ear pain, hearing loss, nosebleeds, sinus pain, sore throat and tinnitus.   Eyes:  Negative for blurred vision, double vision, photophobia, pain, discharge and redness.  Respiratory:  Negative for cough, hemoptysis, sputum production, shortness of breath, wheezing and stridor.   Cardiovascular:  Negative for chest pain, palpitations,  orthopnea, claudication, leg swelling and PND.  Gastrointestinal:  Negative for abdominal pain, blood in stool, constipation, diarrhea, heartburn, melena, nausea and vomiting.  Genitourinary:  Negative for dysuria, flank pain, frequency, hematuria and urgency.  Musculoskeletal:  Negative for back pain, falls, joint pain, myalgias and neck pain.  Skin:  Positive for rash. Negative for itching.  Neurological:  Negative for dizziness, tingling, tremors, sensory change, speech change, focal weakness, seizures, loss of consciousness, weakness and headaches.  Endo/Heme/Allergies:  Negative for environmental allergies and polydipsia. Does not bruise/bleed easily.  Psychiatric/Behavioral:  Negative for depression, hallucinations, memory loss, substance abuse and suicidal ideas. The patient is not nervous/anxious and does not have insomnia.    Disclaimer about ROS at Annual Preventive  Visits Patients are informed before the Review of Systems (ROS) that identifying significant medical issues during the wellness visit may require immediate attention, potentially resulting in a separate billable encounter beyond the scope of the preventive exam. This disclosure is mandated by professional ethics and legal obligations, as healthcare providers must address any substantial health concerns raised during any patient interaction.  A comprehensive ROS is required by insurance companies for billing the visit. However, this structure may inadvertently discourage patients from fully disclosing health concerns due to potential financial implications. Consequently, patients often emphasize that any positive ROS findings are related to stable chronic conditions, requesting that these not be discussed during the preventive visit to avoid additional charges. Patients may also ask that reported complaints not be listed in the ROS to prevent affecting billing.  Problem list overviews that were updated at today's visit: Problem  Central Adiposity  Skin Lesion    Last depression screening scores    05/18/2023    8:21 AM 05/12/2022   11:06 AM 05/02/2021    3:15 PM  PHQ 2/9 Scores  PHQ - 2 Score 0 0 0   Last fall risk screening    05/18/2023    8:21 AM  Fall Risk   Falls in the past year? 0  Number falls in past yr: 0  Injury with Fall? 0  Risk for fall due to : No Fall Risks  Follow up Falls evaluation completed    I attest that I have reviewed and confirmed the patients current medications to meet the medication reconciliation requirement   The following were reviewed and/or entered/updated into our electronic MEDICAL RECORD NUMBERPast Medical History:  Diagnosis Date   Colon polyp    Hx of adenomatous colonic polyps 08/18/2020   Lactose intolerance    Past Surgical History:  Procedure Laterality Date   ANTERIOR CRUCIATE LIGAMENT REPAIR     bilaterally    COLONOSCOPY W/ POLYPECTOMY   05/2020   ORIF FOREARM FRACTURE Left 2013   THYROIDECTOMY     partial   Social History   Socioeconomic History   Marital status: Married    Spouse name: Not on file   Number of children: 3   Years of education: Not on file   Highest education level: Not on file  Occupational History   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[employer]    Employer: [employer]  Tobacco Use   Smoking status: Some Days    Types: Cigarettes   Smokeless tobacco: Current   Tobacco comments:    1/6 ppd  Vaping Use   Vaping status: Some Days  Substance and Sexual Activity   Alcohol use: Yes    Alcohol/week: 0.0 standard drinks of alcohol    Comment: 2 drinks daily   Drug use: Yes  Types: Marijuana   Sexual activity: Yes  Other Topics Concern   Not on file  Social History Narrative   Remarried,    2 kids with  second wife    2010   2008   Son from first marriage born 1997      Engineer [employer]    alcoholic drinks : not daily, social   social smoker    2 caffeinated beverages a day no tobacco or drug use now   Social Drivers of Corporate investment banker Strain: Not on file  Food Insecurity: Not on file  Transportation Needs: Not on file  Physical Activity: Not on file  Stress: Not on file  Social Connections: Not on file  Intimate Partner Violence: Not on file      02/22/2018    4:00 PM  Alcohol Use Disorder Test (AUDIT)  1. How often do you have a drink containing alcohol? 3  2. How many drinks containing alcohol do you have on a typical day when you are drinking? 1  3. How often do you have six or more drinks on one occasion? 2  AUDIT-C Score 6  Alcohol Brief Interventions/Follow-up AUDIT Score <7 follow-up not indicated   Family Status  Relation Name Status   Mother  Alive   Father  Alive   Sister  Alive   MGM  (Not Specified)   Neg Hx  (Not Specified)  No partnership data on file   Family History  Problem Relation Age of Onset   Hypertension Mother    Benign prostatic hyperplasia Father     Colon cancer Maternal Grandmother 82   Sudden death Neg Hx    Hyperlipidemia Neg Hx    Heart attack Neg Hx    Diabetes Neg Hx    Prostate cancer Neg Hx    Allergic rhinitis Neg Hx    Angioedema Neg Hx    Asthma Neg Hx    Eczema Neg Hx    Immunodeficiency Neg Hx    Urticaria Neg Hx    Allergies  Allergen Reactions   Msud Aid [Alitraq] Other (See Comments)   Dairy Aid [Tilactase] Other (See Comments)    Lactose Intolerant   Patient Care Team: Lula Olszewski, MD as PCP - General (Internal Medicine) Iva Boop, MD as Consulting Physician (Gastroenterology)    Objective  BP 116/88   Pulse 75   Temp 97.7 F (36.5 C) (Temporal)   Ht 5\' 9"  (1.753 m)   Wt 197 lb (89.4 kg)   SpO2 97%   BMI 29.09 kg/m Physical ExamBody mass index is 29.09 kg/m. BP Readings from Last 3 Encounters:  05/18/23 116/88  05/12/22 126/84  05/02/21 116/72   Wt Readings from Last 3 Encounters:  05/18/23 197 lb (89.4 kg)  05/12/22 202 lb 8 oz (91.9 kg)  05/02/21 216 lb (98 kg)   GEN: No acute distress, resting comfortably. HEENT: Eardrums exhibit slight thickening bilaterally. External auditory canals clear, no wax present. Significant dental plaque accumulation. Sinuses with mild swelling. Skin lesion on chest, present for at least a year, not consistent with ringworm. NECK: Thyroid and neck examination normal. Possible lymph node palpated in the left axillary region, not definitively enlarged. SKIN: Presence of skin lesions suspected to be ringworm, with scaly texture. Additional red patch on face possibly related to ringworm. CARDIOVASCULAR: S1 and S2 heart sounds with regular rate and rhythm, no murmurs appreciated. PULMONARY: Normal work of breathing, clear to auscultation bilaterally, no crackles, wheezes,  or rhonchi. ABDOMEN: Soft, nontender, nondistended. MSK: No edema, cyanosis, or clubbing noted. SKIN: Warm, dry, no lesions of concern observed. NEUROLOGICAL: Cranial nerves II-XII  grossly intact, strength 5/5 in upper and lower extremities, reflexes symmetric and intact bilaterally. PSYCH: Normal affect and thought content, pleasant and cooperative.  Results Reviewed:     Lula Olszewski, MD  [facility] [phone] (phone) [phone] (fax) [facility] [place]

## 2023-05-18 NOTE — Assessment & Plan Note (Signed)
Skin Lesions   He has persistent skin lesions on the chest and shoulder for at least a year. A referral to dermatology for further evaluation and management has been made, considering a differential diagnosis of chronic skin conditions. We will take photographs of the lesions for the dermatology referral.

## 2023-05-18 NOTE — Patient Instructions (Addendum)
See dental See ophthalmology Reduce carbohydrates Check labs Get rid of ring worm with lotrimin aggressive Sell the motorcycles and find new passion  VISIT SUMMARY:  You came in today for your routine physical examination and lab work. We discussed your concerns about skin sores and ringworm infections, as well as your overall health, including dental and eye care, lifestyle habits, and exercise routine. We also reviewed your history of colon polyps and vision decline.  YOUR PLAN:  -ANNUAL PHYSICAL EXAM: Your routine annual physical exam showed no significant abnormalities. We discussed the importance of regular eye exams, reducing alcohol and CBD use, and the risks associated with motorcycle riding. We recommend 150 minutes of exercise per week and adding heart-healthy foods to your diet. Routine lab work will be performed, and we will screen for sleep apnea.  -CENTRAL ADIPOSITY: Central adiposity refers to excess fat around your abdomen. We discussed the importance of a heart-healthy diet and reducing carbohydrate intake. An A1c test to check for diabetes has been ordered. We recommend incorporating heart-healthy foods such as fish, nuts, extra virgin olive oil, and avocados into your diet.  -TINEA CORPORIS (RINGWORM): Tinea corporis, or ringworm, is a fungal infection of the skin. We recommend using an over-the-counter antifungal cream, such as clotrimazole, consistently for 3-6 months. It's important to treat all affected household members and pets to prevent recurrence.  -SKIN LESIONS: You have persistent skin lesions on your chest and shoulder. We have referred you to dermatology for further evaluation and management. We will take photographs of the lesions for the dermatology referral.  -DENTAL HEALTH: You have significant plaque buildup and are overdue for a dental cleaning. Regular dental visits are important to prevent tooth loss and expensive treatments. We advise scheduling a dental  cleaning and check-up as soon as possible and recommend regular dental visits every 6 months.  INSTRUCTIONS:  We will schedule an annual follow-up visit and follow up on lab results via MyChart. Please ensure you complete the dermatology referral and schedule your dental and eye exams as soon as possible.  Managing Your Health Over Time  Managing every aspect of your health in a single visit isn't always feasible, but that's okay.  We addressed many preventive issues today and charted a course for future care. Acute conditions or preventive care measures may require further attention.  We encourage you to schedule a follow-up visit at your earliest convenience to discuss any unresolved issues.  We strongly encourage continued participation in annual preventive care visits to help [place] develop a more thorough understanding of your health and to help you maintain optimal wellness - please inquire about scheduling your next one with [place] at your earliest convenience.  Next Steps  [x]   Schedule Follow-Up:  We recommend a follow-up appointment in 1 year for your next wellness visit.  If you develop any new problems, want to address any medical issues, or your condition worsens before then, please call us for an appointment or seek emergency care. [x]   Preventive Care:  Make sure to keep regular appointments with dental and vision professionals, use nightly nasal saline mist sprays to keep your sinuses clear and toothbrushing to protect your teeth. Use [employer] App or other app to track your sleep quality. Check blood pressure and heart rate routinely. [x]   Medical Information Release:  For any relevant medical information we don't have, please sign a release form at the front desk so we can obtain it for your records. [x]   Lab Tests:  Schedule  any lab tests from today for within a week to ensure best insurance coverage.    Making the Most of Our Focused (20 minute) Appointments:  [x]   Clearly state your  top concerns at the beginning of the visit to focus our discussion [x]   If you anticipate you will need more time, please inform the front desk during scheduling - we can book multiple appointments in the same week. [x]   If you have transportation problems- use our convenient video appointments or ask about transportation support. [x]   We can get down to business faster if you use MyChart to update information before the visit and submit non-urgent questions before your visit. Thank you for taking the time to provide details through MyChart.  Let our nurse know and she can import this information into your encounter documents.  Arrival and Wait Times: [x]   Arriving on time ensures that everyone receives prompt attention. [x]   Early morning (8a) and afternoon (1p) appointments tend to have shortest wait times. [x]   Unfortunately, we cannot delay appointments for late arrivals or hold slots during phone calls.  Bring to Your Next Appointment  [x]   Medications: Please bring all your medication bottles to your next appointment to ensure we have an accurate record of your prescriptions. [x]   Health Diaries: If you're monitoring any health conditions at home, keeping a diary of your readings can be very helpful for discussions at your next appointment.  Reviewing Your Records  [x]   Review your attached preventive care information at the end of these patient instructions. [x]   Review this early draft of your clinical encounter notes below and the final encounter summary tomorrow on MyChart after its been completed.   Encounter for annual health examination -     CBC with Differential/Platelet -     Comprehensive metabolic panel -     Lipid panel -     TSH -     Hemoglobin A1c  Annual physical exam  Skin lesion -     Ambulatory referral to Dermatology  Central adiposity  Tinea corporis  Poor dentition     Getting Answers and Following Up  [x]   Simple Questions & Concerns: For quick  questions or basic follow-up after your visit, reach [place] at [phone] or MyChart messaging. [x]   Complex Concerns: If your concern is more complex, scheduling an appointment might be best. Discuss this with the staff to find the most suitable option. [x]   Lab & Imaging Results: We'll contact you directly if results are abnormal or you don't use MyChart. Most normal results will be on MyChart within 2-3 business days, with a review message from Dr. Jon Billings. Haven't heard back in 2 weeks? Need results sooner? Contact us at [phone]. [x]   Referrals: Our referral coordinator will manage specialist referrals. The specialist's office should contact you within 2 weeks to schedule an appointment. Call us if you haven't heard from them after 2 weeks.  Staying Connected  [x]   MyChart: Activate your MyChart for the fastest way to access results and message [place]. See the last page of this paperwork for instructions on how to activate.  Billing  [x]   X-ray & Lab Orders: These are billed by separate companies. Contact the invoicing company directly for questions or concerns. [x]   Visit Charges: Discuss any billing inquiries with our administrative services team.  Your Satisfaction Matters  [x]   Share Your Experience: We strive for your satisfaction! If you have any complaints, or preferably compliments, please let Dr.  Jon Billings know directly or contact our [employer], Edwena Felty or [employer], by asking at the front desk.

## 2023-05-20 ENCOUNTER — Encounter: Payer: Self-pay | Admitting: Internal Medicine

## 2023-05-20 DIAGNOSIS — E785 Hyperlipidemia, unspecified: Secondary | ICD-10-CM | POA: Insufficient documentation

## 2023-05-21 NOTE — Telephone Encounter (Signed)
Mailed to pt

## 2023-06-18 ENCOUNTER — Encounter: Payer: Self-pay | Admitting: Dermatology

## 2023-06-18 ENCOUNTER — Ambulatory Visit: Payer: BC Managed Care – PPO | Admitting: Dermatology

## 2023-06-18 ENCOUNTER — Encounter: Payer: Self-pay | Admitting: Internal Medicine

## 2023-06-18 VITALS — BP 168/104

## 2023-06-18 DIAGNOSIS — L281 Prurigo nodularis: Secondary | ICD-10-CM

## 2023-06-18 DIAGNOSIS — S20314A Abrasion of middle front wall of thorax, initial encounter: Secondary | ICD-10-CM | POA: Diagnosis not present

## 2023-06-18 MED ORDER — TACROLIMUS 0.1 % EX OINT: TOPICAL_OINTMENT | Freq: Every day | CUTANEOUS | 0 refills | Status: DC

## 2023-06-18 MED ORDER — TRIAMCINOLONE ACETONIDE 40 MG/ML IJ SUSP
40.0000 mg | Freq: Once | INTRAMUSCULAR | Status: AC
  Administered 2023-06-18: 40 mg via INTRAMUSCULAR

## 2023-06-18 MED ORDER — DOXYCYCLINE HYCLATE 100 MG PO TABS: 100.0000 mg | ORAL_TABLET | Freq: Two times a day (BID) | ORAL | 0 refills | Status: AC

## 2023-06-18 NOTE — Patient Instructions (Addendum)
Hello Martin Pruitt,  Thank you for visiting my office today.  Here is a summary of the key instructions from our consultation:  Medications Prescribed:   Doxycycline: Take twice daily for one week. Remember to take it with food to minimize stomach upset.   Tacrolimus Ointment: Apply daily to the spots on your back and other affected areas until they heal.  Procedure:   An injection has been administered to the thick spot on your chest (Prurigo Nodule) to aid in its flattening.  Lifestyle Adjustments:   Try to substitute the habit of picking at your skin with massaging the prescribed ointment into the affected areas.  Follow-Up:   Please adhere to the prescribed treatments as directed and observe the response.   Schedule a return visit for a follow-up appointment if the spots have not shown improvement in 2 months, or sooner if necessary.  Your prescriptions for Tacrolimus Ointment and Doxycycline have been forwarded to your pharmacy.   Wishing you a prompt and noticeable improvement in your skin condition.  Best regards,  Dr. Langston Reusing,  Dermatologist     Important Information  Due to recent changes in healthcare laws, you may see results of your pathology and/or laboratory studies on MyChart before the doctors have had a chance to review them. We understand that in some cases there may be results that are confusing or concerning to you. Please understand that not all results are received at the same time and often the doctors may need to interpret multiple results in order to provide you with the best plan of care or course of treatment. Therefore, we ask that you please give [place] 2 business days to thoroughly review all your results before contacting the office for clarification. Should we see a critical lab result, you will be contacted sooner.   If You Need Anything After Your Visit  If you have any questions or concerns for your doctor, please call our main line at  [phone] If no one answers, please leave a voicemail as directed and we will return your call as soon as possible. Messages left after 4 pm will be answered the following business day.   You may also send [place] a message via MyChart. We typically respond to MyChart messages within 1-2 business days.  For prescription refills, please ask your pharmacy to contact our office. Our fax number is [phone].  If you have an urgent issue when the clinic is closed that cannot wait until the next business day, you can page your doctor at the number below.    Please note that while we do our best to be available for urgent issues outside of office hours, we are not available 24/7.   If you have an urgent issue and are unable to reach [place], you may choose to seek medical care at your doctor's office, retail clinic, urgent care center, or emergency room.  If you have a medical emergency, please immediately call 911 or go to the emergency department. In the event of inclement weather, please call our main line at [phone] for an update on the status of any delays or closures.  Dermatology Medication Tips: Please keep the boxes that topical medications come in in order to help keep track of the instructions about where and how to use these. Pharmacies typically print the medication instructions only on the boxes and not directly on the medication tubes.   If your medication is too expensive, please contact our office at [phone] or send  [place] a message through MyChart.   We are unable to tell what your co-pay for medications will be in advance as this is different depending on your insurance coverage. However, we may be able to find a substitute medication at lower cost or fill out paperwork to get insurance to cover a needed medication.   If a prior authorization is required to get your medication covered by your insurance company, please allow [place] 1-2 business days to complete this process.  Drug  prices often vary depending on where the prescription is filled and some pharmacies may offer cheaper prices.  The website www.goodrx.com contains coupons for medications through different pharmacies. The prices here do not account for what the cost may be with help from insurance (it may be cheaper with your insurance), but the website can give you the price if you did not use any insurance.  - You can print the associated coupon and take it with your prescription to the pharmacy.  - You may also stop by our office during regular business hours and pick up a GoodRx coupon card.  - If you need your prescription sent electronically to a different pharmacy, notify our office through [facility] or by phone at [phone]

## 2023-06-18 NOTE — Progress Notes (Signed)
New Patient Visit   Subjective  Martin Pruitt is a 48 y.o. male who presents for the following: New Pt - Spot on Chest  Patient states he  has a spot located at the chest that he  would like to have examined. Patient reports the areas have been there for over 1 year. He reports the areas are bothersome.Patient rates irritation (itching)3 out of 10. He states that the areas have not spread. Patient reports he  has previously been treated for these areas by PCP but only provided a referral to dermatology. Patient denies Hx of bx. Patient denies family history of skin cancer(s).   The following portions of the chart were reviewed this encounter and updated as appropriate: medications, allergies, medical history  Review of Systems:  No other skin or systemic complaints except as noted in HPI or Assessment and Plan.  Objective  Well appearing patient in no apparent distress; mood and affect are within normal limits.   A focused examination was performed of the following areas: chest   Relevant exam findings are noted in the Assessment and Plan.       Chest - Medial [facility]) Procedure Note Intralesional Injection  Location: chest  Informed Consent: Discussed risks (infection, pain, bleeding, bruising, thinning of the skin, loss of skin pigment, lack of resolution, and recurrence of lesion) and benefits of the procedure, as well as the alternatives. Informed consent was obtained. Preparation: The area was prepared a standard fashion.  Anesthesia: n/a  Procedure Details: An intralesional injection was performed with Kenalog 40 mg/cc. 0.05 cc in total were injected. NDC #: 9518-8416-60 Exp: 06/2024  Total number of injections: 1  Plan: The patient was instructed on post-op care. Recommend OTC analgesia as needed for pain.   Assessment & Plan   Prurigo Nodule w/ Excoriation  Assessment: Patient has a history of chronic skin picking, focusing on the chest and back. Previous  biopsies (1-2 years ago) of similar lesions on glabella showed cystic lesions, ruling out skin cancer. The chest lesion has grown or become more irritated since the last visit to the family doctor. 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 Lesions are likely folliculitis or small cysts, exacerbated by the patient's picking behavior.  Plan:   Prescribe tacrolimus ointment for daily application until lesions heal. Instruct the patient to massage the ointment into affected areas when the urge to pick occurs.   Prescribe doxycycline 100mg  BID for 1 week to address deeper inflammation.    Administered  intralesional corticosteroid injection (0.05cc) to the thicker lesion on the chest.    Educate the patient on the biological basis of picking behavior and the importance of replacing picking with medication application.    Follow up in 2 months if lesions have not cleared, or as needed.   Procedure Note Intralesional Injection  Location: chest  Informed Consent: Discussed risks (infection, pain, bleeding, bruising, thinning of the skin, loss of skin pigment, lack of resolution, and recurrence of lesion) and benefits of the procedure, as well as the alternatives. Informed consent was obtained. Preparation: The area was prepared a standard fashion.  Anesthesia:n/a  Procedure Details: An intralesional injection was performed with Kenalog 40 mg/cc. 0.05 cc in total were injected. NDC #: 6301-6010-93 Exp: 06/2024  Total number of injections: 1  Plan: The patient was instructed on post-op care. Recommend OTC analgesia as needed for pain.      PRURIGO NODULARIS Chest - Medial [facility])  Related Medications triamcinolone acetonide (KENALOG-40) injection 40 mg   No follow-ups on file.    Documentation: I have reviewed the above documentation for accuracy and completeness, and I agree with the above.   I, Shirron Marcha Solders, CMA, am acting as scribe for  [employer], DO.   Langston Reusing, DO

## 2023-07-28 ENCOUNTER — Other Ambulatory Visit: Payer: Self-pay | Admitting: Dermatology

## 2023-08-01 ENCOUNTER — Ambulatory Visit (HOSPITAL_BASED_OUTPATIENT_CLINIC_OR_DEPARTMENT_OTHER)

## 2023-08-01 ENCOUNTER — Ambulatory Visit (HOSPITAL_BASED_OUTPATIENT_CLINIC_OR_DEPARTMENT_OTHER): Payer: BC Managed Care – PPO | Admitting: Orthopaedic Surgery

## 2023-08-01 DIAGNOSIS — M25562 Pain in left knee: Secondary | ICD-10-CM

## 2023-08-01 DIAGNOSIS — M1712 Unilateral primary osteoarthritis, left knee: Secondary | ICD-10-CM | POA: Diagnosis not present

## 2023-08-01 DIAGNOSIS — G8929 Other chronic pain: Secondary | ICD-10-CM

## 2023-08-01 DIAGNOSIS — Z4789 Encounter for other orthopedic aftercare: Secondary | ICD-10-CM | POA: Diagnosis not present

## 2023-08-01 MED ORDER — LIDOCAINE HCL 1 % IJ SOLN
4.0000 mL | INTRAMUSCULAR | Status: AC | PRN
  Administered 2023-08-01: 4 mL

## 2023-08-01 MED ORDER — TRIAMCINOLONE ACETONIDE 40 MG/ML IJ SUSP
80.0000 mg | INTRAMUSCULAR | Status: AC | PRN
  Administered 2023-08-01: 80 mg via INTRA_ARTICULAR

## 2023-08-01 NOTE — Progress Notes (Signed)
 Chief Complaint: Left knee pain     History of Present Illness:    Martin Pruitt is a 48 y.o. male presents today with ongoing left knee pain.  He is status post bilateral ACL reconstruction left side being done in 1995 in [place] where he was living.  He is concerned that his lateral interference screw is bothering him on the left.  At today's visit he has predominantly lateral based joint symptoms.  He works for [employer] here and lives in [place].  He has pain with activity and this is worsening over the last several months    PMH/PSH/Family History/Social History/Meds/Allergies:    Past Medical History:  Diagnosis Date  . Colon polyp   . Hx of adenomatous colonic polyps 08/18/2020  . Lactose intolerance    Past Surgical History:  Procedure Laterality Date  . ANTERIOR CRUCIATE LIGAMENT REPAIR     bilaterally   . COLONOSCOPY W/ POLYPECTOMY  05/2020  . ORIF FOREARM FRACTURE Left 2013  . THYROIDECTOMY     partial   Social History   Socioeconomic History  . Marital status: Married    Spouse name: Not on file  . Number of children: 3  . Years of education: Not on file  . Highest education level: Not on file  Occupational History  . 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 Smoking status: Some Days    Types: Cigarettes  . Smokeless tobacco: Current  . Tobacco comments:    1/6 ppd  Vaping Use  . Vaping status: Some Days  Substance and Sexual Activity  . Alcohol use: Yes    Alcohol/week: 0.0 standard drinks of alcohol    Comment: 2 drinks daily  . Drug use: Yes    Types: Marijuana  . Sexual activity: Yes  Other Topics Concern  . Not on file  Social History Narrative   Remarried,    2 kids with  second wife    2010   2008   Son from first marriage born 1997      Engineer [employer]    alcoholic drinks : not daily, social   social smoker    2 caffeinated beverages a day no tobacco or drug use now   Social Drivers of Manufacturing engineer Strain: Not on file  Food Insecurity: Not on file  Transportation Needs: Not on file  Physical Activity: Not on file  Stress: Not on file  Social Connections: Not on file   Family History  Problem Relation Age of Onset  . Hypertension Mother   . Benign prostatic hyperplasia Father   . Colon cancer Maternal Grandmother 80  . Sudden death Neg Hx   . Hyperlipidemia Neg Hx   . Heart attack Neg Hx   . Diabetes Neg Hx   . Prostate cancer Neg Hx   . Allergic rhinitis Neg Hx   . Angioedema Neg Hx   . Asthma Neg Hx   . Eczema Neg Hx   . Immunodeficiency Neg Hx   . Urticaria Neg Hx    Allergies  Allergen Reactions  . Msud Aid [Alitraq] Other (See Comments)  . Dairy Aid [Tilactase] Other (See Comments)    Lactose Intolerant   Current Outpatient Medications  Medication Sig Dispense Refill  . tacrolimus (PROTOPIC) 0.1 % ointment APPLY TOPICALLY DAILY. USE ON AFFECTED AREAS. 100 g 0   No current facility-administered medications for this visit.   No results found.  Review  of Systems:   A ROS was performed including pertinent positives and negatives as documented in the HPI.  Physical Exam :   Constitutional: NAD and appears stated age Neurological: Alert and oriented Psych: Appropriate affect and cooperative There were no vitals taken for this visit.   Comprehensive Musculoskeletal Exam:    Tenderness about the lateral IT band as it inserts near Gertie's tubercle.  There is no tenderness about the interference screw.  Negative Lachman.  Minimal lateral joint line tenderness with equivocal McMurray.  Very tight IT band   Imaging:   Xray (4 views left knee): Status post ACL reconstruction without evidence of complication     I personally reviewed and interpreted the radiographs.   Assessment and Plan:   48 y.o. male status post left knee ACL reconstruction with today what appears to be IT band tightness and symptoms.  I will plan to see him back  in 2 months to reassess his progress  -Left knee IT band injection provided after verbal consent obtained    Procedure Note  Patient: Martin Pruitt             Date of Birth: [birth year]           MRN: [id number]             Visit Date: 08/01/2023  Procedures: Visit Diagnoses:  1. Chronic pain of left knee     Large Joint Inj: L knee on 08/01/2023 5:18 PM Indications: pain Details: 22 G 1.5 in needle, ultrasound-guided anterior approach  Arthrogram: No  Medications: 4 mL lidocaine 1 %; 80 mg triamcinolone acetonide 40 MG/ML Outcome: tolerated well, no immediate complications Procedure, treatment alternatives, risks and benefits explained, specific risks discussed. Consent was given by the patient. Immediately prior to procedure a time out was called to verify the correct patient, procedure, equipment, support staff and site/side marked as required. Patient was prepped and draped in the usual sterile fashion.       I personally saw and evaluated the patient, and participated in the management and treatment plan.  Huel Cote, MD Attending Physician, Orthopedic Surgery  This document was dictated using Dragon voice recognition software. A reasonable attempt at proof reading has been made to minimize errors.

## 2023-08-21 ENCOUNTER — Ambulatory Visit (AMBULATORY_SURGERY_CENTER): Payer: BC Managed Care – PPO | Admitting: *Deleted

## 2023-08-21 VITALS — Ht 69.0 in | Wt 200.0 lb

## 2023-08-21 DIAGNOSIS — Z860100 Personal history of colon polyps, unspecified: Secondary | ICD-10-CM

## 2023-08-21 MED ORDER — SUFLAVE 178.7 G PO SOLR: 1.0000 | Freq: Once | ORAL | 0 refills | Status: AC

## 2023-08-21 NOTE — Progress Notes (Signed)
 Pt's name and DOB verified at the beginning of the pre-visit wit 2 identifiers  Pt denies any difficulty with ambulating,sitting, laying down or rolling side to side  Pt has no issues with ambulation   Pt has no issues moving head neck or swallowing  No egg or soy allergy known to patient   No issues known to pt with past sedation with any surgeries or procedures  Pt denies having issues being intubated  No FH of Malignant Hyperthermia  Pt is not on diet pills or shots  Pt is not on home 02   Pt is not on blood thinners   Pt denies issues with constipation   Pt has frequent issues with constipation RN instructed pt to use Miralax per bottles instructions a week before prep days. Pt states they will  Pt is not on dialysis  Pt denise any abnormal heart rhythms   Pt denies any upcoming cardiac testing  Chart not reviewed by CRNA prior to PV  Visit in person  Pt states weight is190 lb   IInstructions reviewed. Pt given [facility] main # and MD on call # prior to instructions.  Pt states understanding of instructions. Instructed to review again prior to procedure. Pt states they will.   Informed pt that they will receive a text or  call from [facility] regarding there prep med.  Instructions given to pt Instructed not to smoke Marijuana day before and day of procedure.

## 2023-08-31 ENCOUNTER — Encounter: Payer: Self-pay | Admitting: Gastroenterology

## 2023-09-02 NOTE — Progress Notes (Unsigned)
 [facility] History and Physical   Primary Care Physician:  Lula Olszewski, MD   Reason for Procedure:   Hx colon polyps  Plan:    colonoscopy     HPI: Martin Pruitt is a 48 y.o. male s/p colonoscopy 2022 9[name]) with 2 adenomas + grandmother has had CRCA.   Past Medical History:  Diagnosis Date   Colon polyp    Hx of adenomatous colonic polyps 08/18/2020   Lactose intolerance     Past Surgical History:  Procedure Laterality Date   ANTERIOR CRUCIATE LIGAMENT REPAIR     bilaterally    COLONOSCOPY     COLONOSCOPY W/ POLYPECTOMY  05/2020   ORIF FOREARM FRACTURE Left 2013   THYROIDECTOMY     partial    Prior to Admission medications   Medication Sig Start Date End Date Taking? Authorizing Provider  tacrolimus (PROTOPIC) 0.1 % ointment APPLY TOPICALLY DAILY. USE ON AFFECTED AREAS. 07/30/23   Terri Piedra, DO    Current Outpatient Medications  Medication Sig Dispense Refill   tacrolimus (PROTOPIC) 0.1 % ointment APPLY TOPICALLY DAILY. USE ON AFFECTED AREAS. 100 g 0   No current facility-administered medications for this visit.    Allergies as of 09/03/2023 - Review Complete 08/21/2023  Allergen Reaction Noted   Msud aid [alitraq] Other (See Comments) 04/05/2020   Dairy aid [tilactase] Other (See Comments) 11/21/2013    Family History  Problem Relation Age of Onset   Hypertension Mother    Benign prostatic hyperplasia Father    Colon cancer Maternal Grandmother 17   Sudden death Neg Hx    Hyperlipidemia Neg Hx    Heart attack Neg Hx    Diabetes Neg Hx    Prostate cancer Neg Hx    Allergic rhinitis Neg Hx    Angioedema Neg Hx    Asthma Neg Hx    Eczema Neg Hx    Immunodeficiency Neg Hx    Urticaria Neg Hx    Colon polyps Neg Hx    Esophageal cancer Neg Hx    Rectal cancer Neg Hx    Stomach cancer Neg Hx     Social History   Socioeconomic History   Marital status: Married    Spouse name: Not on file   Number of children: 3    Years of education: Not on file   Highest education level: Not on file  Occupational History   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[employer]    Employer: [employer]  Tobacco Use   Smoking status: Some Days    Types: Cigarettes   Smokeless tobacco: Former    Types: Chew   Tobacco comments:    1/6 ppd  Vaping Use   Vaping status: Some Days  Substance and Sexual Activity   Alcohol use: Yes    Alcohol/week: 0.0 standard drinks of alcohol    Comment: 2 drinks daily   Drug use: Yes    Types: Marijuana   Sexual activity: Yes  Other Topics Concern   Not on file  Social History Narrative   Remarried,    2 kids with  second wife    2010   2008   Son from first marriage born 1997      Engineer [employer]    alcoholic drinks : not daily, social   social smoker    2 caffeinated beverages a day no tobacco or drug use now   Social Drivers of Corporate investment banker Strain: Not on [employer] Insecurity: Not on  file  Transportation Needs: Not on file  Physical Activity: Not on file  Stress: Not on file  Social Connections: Not on file  Intimate Partner Violence: Not on file    Review of Systems: Positive for *** All other review of systems negative except as mentioned in the HPI.  Physical Exam: Vital signs There were no vitals taken for this visit.  General:   Alert,  Well-developed, well-nourished, pleasant and cooperative in NAD Lungs:  Clear throughout to auscultation.   Heart:  Regular rate and rhythm; no murmurs, clicks, rubs,  or gallops. Abdomen:  Soft, nontender and nondistended. Normal bowel sounds.   Neuro/Psych:  Alert and cooperative. Normal mood and affect. A and O x 3   @Zahari Fazzino  Sena Slate, MD, [facility] [phone] (pager) 09/02/2023 5:38 PM@

## 2023-09-03 ENCOUNTER — Encounter: Payer: Self-pay | Admitting: Internal Medicine

## 2023-09-03 ENCOUNTER — Ambulatory Visit (AMBULATORY_SURGERY_CENTER): Payer: BC Managed Care – PPO | Admitting: Internal Medicine

## 2023-09-03 VITALS — BP 115/70 | HR 60 | Temp 97.9°F | Resp 18 | Ht 69.0 in | Wt 200.0 lb

## 2023-09-03 DIAGNOSIS — D120 Benign neoplasm of cecum: Secondary | ICD-10-CM

## 2023-09-03 DIAGNOSIS — Z1211 Encounter for screening for malignant neoplasm of colon: Secondary | ICD-10-CM

## 2023-09-03 DIAGNOSIS — Z860100 Personal history of colon polyps, unspecified: Secondary | ICD-10-CM

## 2023-09-03 DIAGNOSIS — K635 Polyp of colon: Secondary | ICD-10-CM | POA: Diagnosis not present

## 2023-09-03 DIAGNOSIS — Z9889 Other specified postprocedural states: Secondary | ICD-10-CM

## 2023-09-03 DIAGNOSIS — Z860101 Personal history of adenomatous and serrated colon polyps: Secondary | ICD-10-CM | POA: Diagnosis not present

## 2023-09-03 DIAGNOSIS — D122 Benign neoplasm of ascending colon: Secondary | ICD-10-CM

## 2023-09-03 MED ORDER — SODIUM CHLORIDE 0.9 % IV SOLN: 500.0000 mL | Freq: Once | INTRAVENOUS | Status: DC

## 2023-09-03 NOTE — Patient Instructions (Addendum)
 I found and removed 2 tiny polyps today.  I will let you know pathology results and when to have another routine colonoscopy by mail and/or My Chart.  I appreciate the opportunity to care for you. Iva Boop, MD, FACG  YOU HAD AN ENDOSCOPIC PROCEDURE TODAY AT THE [facility]:   Refer to the procedure report that was given to you for any specific questions about what was found during the examination.  If the procedure report does not answer your questions, please call your gastroenterologist to clarify.  If you requested that your care partner not be given the details of your procedure findings, then the procedure report has been included in a sealed envelope for you to review at your convenience later.  YOU SHOULD EXPECT: Some feelings of bloating in the abdomen. Passage of more gas than usual.  Walking can help get rid of the air that was put into your GI tract during the procedure and reduce the bloating. If you had a lower endoscopy (such as a colonoscopy or flexible sigmoidoscopy) you may notice spotting of blood in your stool or on the toilet paper. If you underwent a bowel prep for your procedure, you may not have a normal bowel movement for a few days.  Please Note:  You might notice some irritation and congestion in your nose or some drainage.  This is from the oxygen used during your procedure.  There is no need for concern and it should clear up in a day or so.  SYMPTOMS TO REPORT IMMEDIATELY:  Following lower endoscopy (colonoscopy or flexible sigmoidoscopy):  Excessive amounts of blood in the stool  Significant tenderness or worsening of abdominal pains  Swelling of the abdomen that is new, acute  Fever of 100F or higher  For urgent or emergent issues, a gastroenterologist can be reached at any hour by calling [phone]. Do not use MyChart messaging for urgent concerns.    DIET:  We do recommend a small meal at first, but then you may proceed to your  regular diet.  Drink plenty of fluids but you should avoid alcoholic beverages for 24 hours.  ACTIVITY:  You should plan to take it easy for the rest of today and you should NOT DRIVE or use heavy machinery until tomorrow (because of the sedation medicines used during the test).    FOLLOW UP: Our staff will call the number listed on your records the next business day following your procedure.  We will call around 7:15- 8:00 am to check on you and address any questions or concerns that you may have regarding the information given to you following your procedure. If we do not reach you, we will leave a message.     If any biopsies were taken you will be contacted by phone or by letter within the next 1-3 weeks.  Please call us at [phone] if you have not heard about the biopsies in 3 weeks.    SIGNATURES/CONFIDENTIALITY: You and/or your care partner have signed paperwork which will be entered into your electronic medical record.  These signatures attest to the fact that that the information above on your After Visit Summary has been reviewed and is understood.  Full responsibility of the confidentiality of this discharge information lies with you and/or your care-partner.

## 2023-09-03 NOTE — Progress Notes (Signed)
 Pt's states no medical or surgical changes since previsit or office visit.

## 2023-09-03 NOTE — Op Note (Signed)
 [facility] Patient Name: Martin Pruitt Procedure Date: 09/03/2023 8:52 AM MRN: [id number] Endoscopist: Iva Boop , MD, [id number] Age: 48 Referring MD:  Date of Birth: 1976-04-25 Gender: Male Account #: [id number] Procedure:                Colonoscopy Indications:              High risk colon cancer surveillance: Personal                            history of adenoma less than 10 mm in size, Last                            colonoscopy: 2022 Medicines:                Monitored Anesthesia Care Procedure:                Pre-Anesthesia Assessment:                           - Prior to the procedure, a History and Physical                            was performed, and patient medications and                            allergies were reviewed. The patient's tolerance of                            previous anesthesia was also reviewed. The risks                            and benefits of the procedure and the sedation                            options and risks were discussed with the patient.                            All questions were answered, and informed consent                            was obtained. Prior Anticoagulants: The patient has                            taken no anticoagulant or antiplatelet agents. ASA                            Grade Assessment: II - A patient with mild systemic                            disease. After reviewing the risks and benefits,                            the patient was deemed in satisfactory condition to  undergo the procedure.                           After obtaining informed consent, the colonoscope                            was passed under direct vision. Throughout the                            procedure, the patient's blood pressure, pulse, and                            oxygen saturations were monitored continuously. The                            Olympus Scope SN: [id number] was introduced  through                            the anus and advanced to the the cecum, identified                            by appendiceal orifice and ileocecal valve. The                            colonoscopy was performed without difficulty. The                            patient tolerated the procedure well. The quality                            of the bowel preparation was good. The ileocecal                            valve, appendiceal orifice, and rectum were                            photographed. The bowel preparation used was                            SUFLAVE via split dose instruction. Scope In: 9:08:12 AM Scope Out: 9:20:51 AM Scope Withdrawal Time: 0 hours 10 minutes 43 seconds  Total Procedure Duration: 0 hours 12 minutes 39 seconds  Findings:                 The perianal and digital rectal examinations were                            normal.                           Two sessile polyps were found in the ascending                            colon and cecum. The polyps were 1 to 3 mm in size.  These polyps were removed with a cold snare.                            Resection and retrieval were complete. Verification                            of patient identification for the specimen was                            done. Estimated blood loss was minimal.                           A scar was found in the rectum.                           The exam was otherwise without abnormality on                            direct and retroflexion views. Complications:            No immediate complications. Estimated Blood Loss:     Estimated blood loss was minimal. Impression:               - Two 1 to 3 mm polyps in the ascending colon and                            in the cecum, removed with a cold snare. Resected                            and retrieved.                           - Scars in the rectum from prior hemorrhoid banding                           - The  examination was otherwise normal on direct                            and retroflexion views.                           - Personal history of colonic polyps. 2 adenomas <                            1 cm 2022. Grandmother had colon cancer but no                            other family history of colon cancer. Recommendation:           - Patient has a contact number available for                            emergencies. The signs and symptoms of potential  delayed complications were discussed with the                            patient. Return to normal activities tomorrow.                            Written discharge instructions were provided to the                            patient.                           - Resume previous diet.                           - Continue present medications.                           - Await pathology results.                           - Repeat colonoscopy is recommended for                            surveillance. The colonoscopy date will be                            determined after pathology results from today's                            exam become available for review. Iva Boop, MD 09/03/2023 9:30:54 AM This report has been signed electronically.

## 2023-09-03 NOTE — Progress Notes (Signed)
 Called to room to assist during endoscopic procedure.  Patient ID and intended procedure confirmed with present staff. Received instructions for my participation in the procedure from the performing physician.

## 2023-09-03 NOTE — Progress Notes (Signed)
 To pacu, VSS. Report to [email or web address]

## 2023-09-04 ENCOUNTER — Telehealth: Payer: Self-pay

## 2023-09-04 NOTE — Telephone Encounter (Signed)
 Post procedure follow up call, no answer

## 2023-09-05 LAB — SURGICAL PATHOLOGY

## 2023-09-07 ENCOUNTER — Encounter: Payer: Self-pay | Admitting: Internal Medicine

## 2023-09-09 ENCOUNTER — Other Ambulatory Visit: Payer: Self-pay | Admitting: Dermatology

## 2023-10-03 ENCOUNTER — Ambulatory Visit (HOSPITAL_BASED_OUTPATIENT_CLINIC_OR_DEPARTMENT_OTHER): Admitting: Orthopaedic Surgery

## 2023-10-16 ENCOUNTER — Encounter: Payer: Self-pay | Admitting: Dermatology

## 2023-10-16 ENCOUNTER — Ambulatory Visit: Admitting: Dermatology

## 2023-10-16 VITALS — BP 148/100

## 2023-10-16 DIAGNOSIS — D492 Neoplasm of unspecified behavior of bone, soft tissue, and skin: Secondary | ICD-10-CM | POA: Diagnosis not present

## 2023-10-16 DIAGNOSIS — L739 Follicular disorder, unspecified: Secondary | ICD-10-CM

## 2023-10-16 DIAGNOSIS — D485 Neoplasm of uncertain behavior of skin: Secondary | ICD-10-CM

## 2023-10-16 DIAGNOSIS — L281 Prurigo nodularis: Secondary | ICD-10-CM | POA: Diagnosis not present

## 2023-10-16 DIAGNOSIS — L700 Acne vulgaris: Secondary | ICD-10-CM

## 2023-10-16 DIAGNOSIS — L709 Acne, unspecified: Secondary | ICD-10-CM | POA: Diagnosis not present

## 2023-10-16 MED ORDER — CLINDAMYCIN PHOSPHATE 1 % EX LOTN
TOPICAL_LOTION | CUTANEOUS | 0 refills | Status: AC
Start: 2023-10-16 — End: ?

## 2023-10-16 MED ORDER — DOXYCYCLINE HYCLATE 100 MG PO TABS: 100.0000 mg | ORAL_TABLET | Freq: Two times a day (BID) | ORAL | 0 refills | Status: AC

## 2023-10-16 NOTE — Patient Instructions (Addendum)
 Date: Tue Oct 16 2023  Hello Martin Pruitt,  Thank you for visiting today. Here is a summary of the key instructions:  - Medications:   - Take doxycycline  twice a day for one week with food   - Apply clindamycin to the thigh daily  - Skin Care:   - Apply Vaseline or Aquaphor and a Band-Aid daily to the chest area where the nodule was removed   - Use LaRoche-Posay benzoyl peroxide to spot-treat the forehead and chest daily   - UseCeraVe benzoyl peroxide wash after every workout, followed by Everardo Hitch soap to hydrate   - Apply clindamycin to problem areas like the back of thighs   - Follow-up:   - Return in 2 months to check on the topical treatments   - Watch for a message with biopsy results  Please reach out if you have any questions or concerns.  Best Regards,  Dr. [facility]   Patient Handout: Wound Care for Skin Biopsy Site  Taking Care of Your Skin Biopsy Site  Proper care of the biopsy site is essential for promoting healing and minimizing scarring. This handout provides instructions on how to care for your biopsy site to ensure optimal recovery.  1. Cleaning the Wound:  Clean the biopsy site daily with gentle soap and water. Gently pat the area dry with a clean, soft towel. Avoid harsh scrubbing or rubbing the area, as this can irritate the skin and delay healing.  2. Applying Aquaphor and Bandage:  After cleaning the wound, apply a thin layer of Aquaphor ointment to the biopsy site. Cover the area with a sterile bandage to protect it from dirt, bacteria, and friction. Change the bandage daily or as needed if it becomes soiled or wet.  3. Continued Care for One Week:  Repeat the cleaning, Aquaphor application, and bandaging process daily for one week following the biopsy procedure. Keeping the wound clean and moist during this initial healing period will help prevent infection and promote optimal healing.  4. Massaging Aquaphor into the Area:  ---After  one week, discontinue the use of bandages but continue to apply Aquaphor to the biopsy site. ----Gently massage the Aquaphor into the area using circular motions. ---Massaging the skin helps to promote circulation and prevent the formation of scar tissue.   Additional Tips:  Avoid exposing the biopsy site to direct sunlight during the healing process, as this can cause hyperpigmentation or worsen scarring. If you experience any signs of infection, such as increased redness, swelling, warmth, or drainage from the wound, contact your healthcare provider immediately. Follow any additional instructions provided by your healthcare provider for caring for the biopsy site and managing any discomfort. Conclusion:  Taking proper care of your skin biopsy site is crucial for ensuring optimal healing and minimizing scarring. By following these instructions for cleaning, applying Aquaphor, and massaging the area, you can promote a smooth and successful recovery. If you have any questions or concerns about caring for your biopsy site, don't hesitate to contact your healthcare provider for guidance.     Important Information  Due to recent changes in healthcare laws, you may see results of your pathology and/or laboratory studies on MyChart before the doctors have had a chance to review them. We understand that in some cases there may be results that are confusing or concerning to you. Please understand that not all results are received at the same time and often the doctors may need to interpret multiple results in order to  provide you with the best plan of care or course of treatment. Therefore, we ask that you please give us  2 business days to thoroughly review all your results before contacting the office for clarification. Should we see a critical lab result, you will be contacted sooner.   If You Need Anything After Your Visit  If you have any questions or concerns for your doctor, please call our main  line at [phone] If no one answers, please leave a voicemail as directed and we will return your call as soon as possible. Messages left after 4 pm will be answered the following business day.   You may also send us  a message via MyChart. We typically respond to MyChart messages within 1-2 business days.  For prescription refills, please ask your pharmacy to contact our office. Our fax number is [phone].  If you have an urgent issue when the clinic is closed that cannot wait until the next business day, you can page your doctor at the number below.    Please note that while we do our best to be available for urgent issues outside of office hours, we are not available 24/7.   If you have an urgent issue and are unable to reach us , you may choose to seek medical care at your doctor's office, retail clinic, urgent care center, or emergency room.  If you have a medical emergency, please immediately call 911 or go to the emergency department. In the event of inclement weather, please call our main line at [phone] for an update on the status of any delays or closures.  Dermatology Medication Tips: Please keep the boxes that topical medications come in in order to help keep track of the instructions about where and how to use these. Pharmacies typically print the medication instructions only on the boxes and not directly on the medication tubes.   If your medication is too expensive, please contact our office at [phone] or send us  a message through MyChart.   We are unable to tell what your co-pay for medications will be in advance as this is different depending on your insurance coverage. However, we may be able to find a substitute medication at lower cost or fill out paperwork to get insurance to cover a needed medication.   If a prior authorization is required to get your medication covered by your insurance company, please allow us  1-2 business days to complete this  process.  Drug prices often vary depending on where the prescription is filled and some pharmacies may offer cheaper prices.  The website www.goodrx.com contains coupons for medications through different pharmacies. The prices here do not account for what the cost may be with help from insurance (it may be cheaper with your insurance), but the website can give you the price if you did not use any insurance.  - You can print the associated coupon and take it with your prescription to the pharmacy.  - You may also stop by our office during regular business hours and pick up a GoodRx coupon card.  - If you need your prescription sent electronically to a different pharmacy, notify our office through [facility] or by phone at [phone]

## 2023-10-16 NOTE — Progress Notes (Signed)
 Follow-Up Visit   Subjective  Martin Pruitt is a 48 y.o. male who presents for the following: Prurigo Nodularis  Patient present today for follow up visit. Patient was last evaluated on 06/18/23. At this visit patient was prescribed:  Tacrolimus  - use on affected area daily until healed Doxycycline  100mg - take BID for 1 week Kenalog  injections administered during OV  . Patient reports sxs are unchanged stating that it comes and goes despite applying tacrolimus  and taking doxycycline  as prescribed.  Hee has 2 spots he would like looked at today that pt beileves is also prurigo nodularis. Patient denies medication changes.  The following portions of the chart were reviewed this encounter and updated as appropriate: medications, allergies, medical history  Review of Systems:  No other skin or systemic complaints except as noted in HPI or Assessment and Plan.  Objective  Well appearing patient in no apparent distress; mood and affect are within normal limits.   A focused examination was performed of the following areas: chest & face   Relevant exam findings are noted in the Assessment and Plan.              Chest - Medial (Center) 1.5cm pink tender nodule   Assessment & Plan    1. Chest nodule - Assessment: Persistent, irritating nodule on the chest, appearing flatter in recent pictures but remaining symptomatic. Nodule measures 1.5 cm, is pink in color, and tender to touch. Concern for potential skin cancer. - Plan:    Perform shave biopsy of the chest nodule     - Informed patient of procedure: numbing, shaving off the nodule, and sending for pathology    Apply Vaseline or Aquaphor and Band-Aid daily post-biopsy    Follow up with biopsy results via message  2. Forehead lesion - Assessment: Recurrent lesion on the forehead, previously removed but returning. Described as a painful cystic acne bump that appeared about a month ago. Daily helmet use may be contributing.  Patient has been using tacrolimus  as prescribed. - Plan:    Perform biopsy of the forehead lesion    Initiate doxycycline  100 mg PO BID for 1 week     - Take with food to avoid upset stomach    Apply LaRoche-Posay benzoyl peroxide to spot-treat forehead lesion daily    Continue tacrolimus  application as previously prescribed  3. Leg folliculitis - Assessment: Recurrent folliculitis on the leg, likely exacerbated by sweating and friction during cycling activities. Chronic issue not responding adequately to previous treatments, including doxycycline . - Plan:    Apply clindamycin  to affected area on thigh daily until cleared    Use benzoyl peroxide wash to reduce bacterial count, especially after workouts    Follow up on culture results to determine if medication change is necessary    Educate patient on post-workout hygiene:     - Shower after every workout     - Use benzoyl peroxide wash followed by Everardo Hitch soap for hydration  4. Acne management - Assessment: History of acne not adequately controlled with previous treatments. - Plan:    Initiate doxycycline  100 mg PO BID for 1 week    Apply benzoyl peroxide spot treatment to chest daily    Use benzoyl peroxide wash to prevent chronic folliculitis, especially in problem areas like back of thighs    Follow up in 2 months to assess efficacy of topical treatments  NEOPLASM OF UNCERTAIN BEHAVIOR OF SKIN Chest - Medial (Center) Skin / nail biopsy Type of biopsy:  tangential   Informed consent: discussed and consent obtained   Timeout: patient name, date of birth, surgical site, and procedure verified   Procedure prep:  Patient was prepped and draped in usual sterile fashion Prep type:  Isopropyl alcohol Anesthesia: the lesion was anesthetized in a standard fashion   Anesthetic:  1% lidocaine  w/ epinephrine 1-100,000 buffered w/ 8.4% NaHCO3 Instrument used: DermaBlade   Hemostasis achieved with: aluminum chloride   Outcome: patient  tolerated procedure well   Post-procedure details: sterile dressing applied and wound care instructions given   Dressing type: petrolatum gauze and bandage   Specimen 1 - Surgical pathology Differential Diagnosis: r/o SCC  Check Margins: yes FOLLICULITIS   Related Procedures Aerobic Culture Related Medications clindamycin  (CLEOCIN -T) 1 % lotion Apply to thigh daily until clear doxycycline  (VIBRA -TABS) 100 MG tablet Take 1 tablet (100 mg total) by mouth 2 (two) times daily for 7 days.  Return in about 2 months (around 12/16/2023) for PRURIGO NODULARIS.   Documentation: I have reviewed the above documentation for accuracy and completeness, and I agree with the above.  I, Shirron Louanne Roussel, CMA, am acting as scribe for [employer], DO.   Louana Roup, DO

## 2023-10-19 LAB — SURGICAL PATHOLOGY

## 2023-10-19 LAB — AEROBIC CULTURE

## 2023-10-29 ENCOUNTER — Ambulatory Visit: Payer: Self-pay | Admitting: Dermatology

## 2024-01-15 ENCOUNTER — Ambulatory Visit: Admitting: Dermatology

## 2024-01-15 ENCOUNTER — Encounter: Payer: Self-pay | Admitting: Dermatology

## 2024-01-15 VITALS — BP 154/102 | HR 75

## 2024-01-15 DIAGNOSIS — L281 Prurigo nodularis: Secondary | ICD-10-CM | POA: Diagnosis not present

## 2024-01-15 DIAGNOSIS — L719 Rosacea, unspecified: Secondary | ICD-10-CM | POA: Insufficient documentation

## 2024-01-15 NOTE — Patient Instructions (Addendum)
 Date: Tue Jan 15 2024  Hello Martin Pruitt,  Thank you for visiting today. Here is a summary of the key instructions:  - Treatment Areas:   - Use hydrocolloid Band-Aids on affected areas:     - Apply every morning and night     - Buy over-the-counter (brands like Neutrogena and LaRoche-Posay)     - If helpful, continue using     - If not helpful, add a little ointment under the patch  - Follow-up:   - Return for follow-up appointment in 3 months  - Other Instructions:   - Stop picking at skin lesions   - Replace picking with rubbing something beneficial on the skin   - Information about Dupixent provided   - Pictures of recommended hydrocolloid Band-Aid brands included in paperwork  Please reach out if you have any questions or concerns.  Warm regards,  Dr. Delon Alm [facility]      Important Information   Due to recent changes in healthcare laws, you may see results of your pathology and/or laboratory studies on MyChart before the doctors have had a chance to review them. We understand that in some cases there may be results that are confusing or concerning to you. Please understand that not all results are received at the same time and often the doctors may need to interpret multiple results in order to provide you with the best plan of care or course of treatment. Therefore, we ask that you please give us  2 business days to thoroughly review all your results before contacting the office for clarification. Should we see a critical lab result, you will be contacted sooner.     If You Need Anything After Your Visit   If you have any questions or concerns for your doctor, please call our main line at [phone]. If no one answers, please leave a voicemail as directed and we will return your call as soon as possible. Messages left after 4 pm will be answered the following business day.    You may also send us  a message via MyChart. We typically respond to MyChart messages  within 1-2 business days.  For prescription refills, please ask your pharmacy to contact our office. Our fax number is [phone].  If you have an urgent issue when the clinic is closed that cannot wait until the next business day, you can page your doctor at the number below.     Please note that while we do our best to be available for urgent issues outside of office hours, we are not available 24/7.    If you have an urgent issue and are unable to reach us , you may choose to seek medical care at your doctor's office, retail clinic, urgent care center, or emergency room.   If you have a medical emergency, please immediately call 911 or go to the emergency department. In the event of inclement weather, please call our main line at [phone] for an update on the status of any delays or closures.  Dermatology Medication Tips: Please keep the boxes that topical medications come in in order to help keep track of the instructions about where and how to use these. Pharmacies typically print the medication instructions only on the boxes and not directly on the medication tubes.   If your medication is too expensive, please contact our office at [phone] or send us  a message through MyChart.    We are unable to tell what your co-pay for medications will be in  advance as this is different depending on your insurance coverage. However, we may be able to find a substitute medication at lower cost or fill out paperwork to get insurance to cover a needed medication.    If a prior authorization is required to get your medication covered by your insurance company, please allow us  1-2 business days to complete this process.   Drug prices often vary depending on where the prescription is filled and some pharmacies may offer cheaper prices.   The website www.goodrx.com contains coupons for medications through different pharmacies. The prices here do not account for what the cost may be with help from  insurance (it may be cheaper with your insurance), but the website can give you the price if you did not use any insurance.  - You can print the associated coupon and take it with your prescription to the pharmacy.  - You may also stop by our office during regular business hours and pick up a GoodRx coupon card.  - If you need your prescription sent electronically to a different pharmacy, notify our office through [facility] or by phone at [phone]

## 2024-01-15 NOTE — Progress Notes (Signed)
   Follow-Up Visit   Subjective  Martin Pruitt is a 48 y.o. male who presents for the following: PN  Patient present today for follow up visit for PN. Patient was last evaluated on 10/16/23. At this visit patient had bx completed (Confirmed PN) was prescribed Oral Doxy 100  mg tablets, advised to wash in BPO and recommended to continue Tacrolimus  daily. Patient reports sxs are unchanged. Patient denies medication changes. Patient is unsure of which medication he is putting on the areas. He just knows it's something Dr. Alm prescribed. Patient reports the areas are 5 out of 10 on irritation scale.  The following portions of the chart were reviewed this encounter and updated as appropriate: medications, allergies, medical history  Review of Systems:  No other skin or systemic complaints except as noted in HPI or Assessment and Plan.  Objective  Well appearing patient in no apparent distress; mood and affect are within normal limits.  A focused examination was performed of the following areas: Chest  Relevant exam findings are noted in the Assessment and Plan.              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 Despite treatment, continued development of new lesions largely attributed to ongoing picking behavior which patient acknowledges as response to irritation. Current lesions noted on, chest, thigh and arm. Tacrolimus  ointment used as prescribed but has not prevented picking. Clindamycin  not consistently applied  Previous treatments have not provided adequate relief, indicating need for more aggressive management approach.  - Plan:    Initiate hydrocolloid Band-Aids as barrier treatment every morning and night    Recommend Neutrogena or LaRoche-Posay brands    If helpful, continue use; if not, apply tacrolimus  ointment under patch    Discuss potential initiation of  Dupixent (dupilumab) and Nemluvio via auto-injector    Educate on common side effect of injection site tenderness    Advise about 3-week insurance approval process    Continue tacrolimus  ointment as previously prescribed    Continue clindamycin  application with emphasis on consistent use    Educate on importance of cessation of picking behavior for treatment success    Return in about 4 months (around 05/16/2024) for Prurigo Nodularis F/U.  I, Jetta Ager, am acting as Neurosurgeon for [employer], DO.  Documentation: I have reviewed the above documentation for accuracy and completeness, and I agree with the above.  Delon Alm, DO

## 2024-03-31 ENCOUNTER — Encounter: Payer: Self-pay | Admitting: Radiology

## 2024-05-19 ENCOUNTER — Ambulatory Visit (INDEPENDENT_AMBULATORY_CARE_PROVIDER_SITE_OTHER): Payer: BC Managed Care – PPO | Admitting: Internal Medicine

## 2024-05-19 ENCOUNTER — Encounter: Payer: Self-pay | Admitting: Internal Medicine

## 2024-05-19 VITALS — BP 118/76 | HR 75 | Temp 97.8°F | Ht 69.0 in | Wt 198.2 lb

## 2024-05-19 DIAGNOSIS — Z720 Tobacco use: Secondary | ICD-10-CM | POA: Diagnosis not present

## 2024-05-19 DIAGNOSIS — E890 Postprocedural hypothyroidism: Secondary | ICD-10-CM

## 2024-05-19 DIAGNOSIS — Z8042 Family history of malignant neoplasm of prostate: Secondary | ICD-10-CM

## 2024-05-19 DIAGNOSIS — Z125 Encounter for screening for malignant neoplasm of prostate: Secondary | ICD-10-CM | POA: Diagnosis not present

## 2024-05-19 DIAGNOSIS — Z0001 Encounter for general adult medical examination with abnormal findings: Secondary | ICD-10-CM | POA: Diagnosis not present

## 2024-05-19 DIAGNOSIS — E781 Pure hyperglyceridemia: Secondary | ICD-10-CM

## 2024-05-19 DIAGNOSIS — E739 Lactose intolerance, unspecified: Secondary | ICD-10-CM | POA: Diagnosis not present

## 2024-05-19 DIAGNOSIS — Z1211 Encounter for screening for malignant neoplasm of colon: Secondary | ICD-10-CM | POA: Diagnosis not present

## 2024-05-19 DIAGNOSIS — L989 Disorder of the skin and subcutaneous tissue, unspecified: Secondary | ICD-10-CM

## 2024-05-19 DIAGNOSIS — Z0000 Encounter for general adult medical examination without abnormal findings: Secondary | ICD-10-CM

## 2024-05-19 DIAGNOSIS — J3089 Other allergic rhinitis: Secondary | ICD-10-CM | POA: Diagnosis not present

## 2024-05-19 DIAGNOSIS — E65 Localized adiposity: Secondary | ICD-10-CM

## 2024-05-19 LAB — CBC WITH DIFFERENTIAL/PLATELET
Basophils Absolute: 0 K/uL (ref 0.0–0.1)
Basophils Relative: 0.6 % (ref 0.0–3.0)
Eosinophils Absolute: 0.1 K/uL (ref 0.0–0.7)
Eosinophils Relative: 2.6 % (ref 0.0–5.0)
HCT: 44.7 % (ref 39.0–52.0)
Hemoglobin: 15.2 g/dL (ref 13.0–17.0)
Lymphocytes Relative: 27.4 % (ref 12.0–46.0)
Lymphs Abs: 1.4 K/uL (ref 0.7–4.0)
MCHC: 34 g/dL (ref 30.0–36.0)
MCV: 86.4 fl (ref 78.0–100.0)
Monocytes Absolute: 0.4 K/uL (ref 0.1–1.0)
Monocytes Relative: 7.8 % (ref 3.0–12.0)
Neutro Abs: 3.1 K/uL (ref 1.4–7.7)
Neutrophils Relative %: 61.6 % (ref 43.0–77.0)
Platelets: 171 K/uL (ref 150.0–400.0)
RBC: 5.18 Mil/uL (ref 4.22–5.81)
RDW: 13.1 % (ref 11.5–15.5)
WBC: 5.1 K/uL (ref 4.0–10.5)

## 2024-05-19 LAB — COMPREHENSIVE METABOLIC PANEL WITH GFR
ALT: 17 U/L (ref 3–53)
AST: 20 U/L (ref 5–37)
Albumin: 4.4 g/dL (ref 3.5–5.2)
Alkaline Phosphatase: 53 U/L (ref 39–117)
BUN: 10 mg/dL (ref 6–23)
CO2: 26 meq/L (ref 19–32)
Calcium: 9 mg/dL (ref 8.4–10.5)
Chloride: 106 meq/L (ref 96–112)
Creatinine, Ser: 1.18 mg/dL (ref 0.40–1.50)
GFR: 72.93 mL/min
Glucose, Bld: 95 mg/dL (ref 70–99)
Potassium: 4.3 meq/L (ref 3.5–5.1)
Sodium: 140 meq/L (ref 135–145)
Total Bilirubin: 0.4 mg/dL (ref 0.2–1.2)
Total Protein: 6.1 g/dL (ref 6.0–8.3)

## 2024-05-19 LAB — LIPID PANEL
Cholesterol: 144 mg/dL (ref 28–200)
HDL: 38.4 mg/dL — ABNORMAL LOW
LDL Cholesterol: 66 mg/dL (ref 10–99)
NonHDL: 105.97
Total CHOL/HDL Ratio: 4
Triglycerides: 199 mg/dL — ABNORMAL HIGH (ref 10.0–149.0)
VLDL: 39.8 mg/dL (ref 0.0–40.0)

## 2024-05-19 LAB — PSA: PSA: 1.17 ng/mL (ref 0.10–4.00)

## 2024-05-19 NOTE — Progress Notes (Signed)
 " [facility] [address] [place], [place] [address] Office:  [phone]  -- Annual Preventive Medical Office Visit --  Patient:  Martin Pruitt      Age: 48 y.o.       Sex:  male  Date:   05/19/2024 Patient Care Team: Jesus Bernardino KANDICE, MD as PCP - General (Internal Medicine) Avram Lupita BRAVO, MD as Consulting Physician (Gastroenterology) Today's Healthcare Provider: Bernardino KANDICE Jesus, MD  ========================================= Chief complaint: Annual Exam (Pt is present for cpe pt is fasting for labs would like to check tsh t3 and 4.)  Purpose of Visit: Comprehensive preventive health assessment and personalized health maintenance planning.  This encounter was conducted as a Comprehensive Physical Exam (CPE) preventive care annual visit. The patient's medical history and problem list were reviewed to inform individualized preventive care recommendations.   No problem-specific medical treatment was provided during this visit.  Assessment & Plan Encounter for annual general medical examination with abnormal findings in adult FH: prostate cancer Encounter for annual health examination Screening for malignant neoplasm of colon Screening for malignant neoplasm of prostate Comprehensive Preventive Examination (Annual Wellness Visit) Performed Today.  Abridge Summary:  Discussed the importance of regular screenings and vaccinations. No interest in pneumonia, hepatitis B, or flu vaccines at this time. Discussed eye and dental care intervals. Continue regular eye exams every 1-2 years and dental care every 6 months. Consider sinus care with saline sprays due to high pollen exposure in [place]. ?? Core Review: Interval HPI/ROS reviewed. History, current Medications (including reconciliation), known Allergies, and Immunization Status were thoroughly reviewed and updated. ?? Risk Assessment: Comprehensive Family, Social (including tobacco/alcohol/substance  use), and Safety/Functional risks were assessed. Vitals and a complete, age-appropriate Physical Exam were fully documented. ? Preventive Planning: I reviewed all appropriate preventive screening tests (e.g., , colonoscopy, PSA,screening) and immunization needs (e.g., influenza, pneumococcal, Zoster). ??? Counseling & Education: Provided individualized counseling on Healthy Diet (e.g., DASH/Mediterranean), Regular Physical Activity (consistent with CDC guidelines), Sleep Hygiene, Stress Management strategies, and Mental Health screening results. ?? Conclusion: Shared decision-making was performed for all recommended interventions. Orders were placed for indicated screenings and/or vaccinations. Written educational materials were provided to reinforce today's counseling and plan. H/O partial thyroidectomy Partial thyroidectomy. Ordered thyroid  function tests to monitor thyroid  status. Pure hypertriglyceridemia Cholesterol profile is below average due to elevated triglycerides and borderline low HDL, with normal LDL levels. Provided a handout on packaged foods and trans fats. Recommended a low carb diet to manage triglycerides and a Mediterranean diet to improve overall cholesterol profile. Central adiposity Central adiposity is likely related to dietary habits and lifestyle factors such as screen time and junk food consumption. Advised dietary modifications to reduce carbohydrate and junk food intake and encouraged regular physical activity. Allergic rhinitis due to other allergic trigger, unspecified seasonality Symptoms of throat clearing and nasal congestion are likely due to environmental allergens, particularly pollen common in [place]. Recommended over-the-counter saline nasal spray for sinus care, especially after allergen exposure. Lactose intolerance Symptoms of bloating after dairy consumption are consistent with lactose intolerance. Continue to avoid dairy products to prevent  symptoms. Tobacco use Occasional smoking, primarily during work breaks. Discussed potential for lung cancer screening at age 1 if smoking continues and considered switching to Zyn pouches as an alternative. 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 Continue monitoring for any changes.     ICD-10-CM   1.  FH: prostate cancer  Z80.42 PSA    2. H/O partial thyroidectomy  E89.0 TSH Rfx on Abnormal to Free T4    3. Encounter for annual general medical examination with abnormal findings in adult  Z00.01 PSA    Lipid panel    Comprehensive metabolic panel with GFR    CBC with Differential/Platelet    TSH Rfx on Abnormal to Free T4    4. Encounter for annual health examination  Z00.00     5. Screening for malignant neoplasm of colon  Z12.11     6. Screening for malignant neoplasm of prostate  Z12.5     7. Pure hypertriglyceridemia  E78.1 Lipid panel       Reviewed/updated/encouraged completion: Immunization History  Administered Date(s) Administered   PFIZER(Purple Top)SARS-COV-2 Vaccination 10/11/2019, 11/01/2019   Tdap 02/10/2015   Health Maintenance Due  Topic Date Due   Pneumococcal Vaccine (1 of 2 - PCV) Never done   Hepatitis B Vaccines 19-59 Average Risk (1 of 3 - 19+ 3-dose series) Never done   Influenza Vaccine  Never done  Not interested in receviving recommended vaccines today   Health Maintenance  Topic Date Due   Pneumococcal Vaccine (1 of 2 - PCV) Never done   Hepatitis B Vaccines 19-59 Average Risk (1 of 3 - 19+ 3-dose series) Never done   Influenza Vaccine  Never done   DTaP/Tdap/Td (2 - Td or Tdap) 02/09/2025   Colonoscopy  09/03/2030   Hepatitis C Screening  Completed   HIV Screening  Completed   HPV VACCINES  Aged Out   Meningococcal B Vaccine  Aged Out   COVID-19 Vaccine  Discontinued    Reviewed the following verbally with patient and provided AVS  materials:   HEALTH MAINTENANCE COUNSELING AND ANTICIPATORY GUIDANCE    Preventive Measure Recommendation  Eye Exams Every 1-2 years  Dental Care Cleanings every 6 months or more, brush/floss 3x daily  Sinus Care Saline spray rinses daily  Sleep 8 hours nightly, good sleep hygiene, e-monitoring if any daytime drowsiness  Diet Fruits/vegetables/fiber/healthy fats, balance and moderation  Exercise 150 minutes weekly  Risk Behaviors Discouraged any/all high risk behaviors    CANCER SCREENING SHARED DECISION MAKING    Penile/Testicle/Scrotum Encouraged self-monitoring and reporting of genital abnormalities. Patient reports none.  Thyroid  Thyroid  was palpated for nodules today.  Prostate Individualized risks/benefits/costs discussed Lab Results  Component Value Date   PSA 1.17 05/19/2024    Colon HM Colonoscopy          Upcoming     Colonoscopy (Every 7 Years) Next due on 09/03/2030    09/03/2023  COLONOSCOPY  Only the first 1 history entries have been loaded, but more history exists.             Discussed need to monitor for changers   Lung Current guidelines recommend individuals aged 52 to 71 who currently smoke or formerly smoked and have a >= 20 pack-year smoking history should undergo annual screening with low-dose computed tomography (LDCT). Tobacco Use: High Risk (05/19/2024)   Patient History    Smoking Tobacco Use: Some Days    Smokeless Tobacco Use: Former    Passive Exposure: Not on file  Tobacco Use History[1]  Skin Advised regular sunscreen use. Patient denies worrisome, changing, or new skin lesions.  Following with dermatology for known lesion  Other Cancers Discussed lack of screening guidelines and insurance coverage for other cancer types.    Discussed the use of AI scribe software for  clinical note transcription with the patient, who gave verbal consent to proceed.  History of Present Illness 48 year old male who presents for a routine physical  exam.  He generally feels healthy without specific concerns but wishes to improve his sleep habits. He stays up late watching TV, particularly football, and sometimes falls asleep in front of the TV.  He has a history of lactose intolerance, which causes bloating. He also has a history of polyps that were removed eight months ago. No current gastrointestinal symptoms are reported.  He exercises regularly, about an hour per day during the week, and has recently quit drinking beer since August. He smokes occasionally, mainly during work breaks with a friend.  He has a family history of prostate cancer, as his father had his prostate removed. His mother is experiencing memory loss, which is becoming more apparent. He is unsure if it is Alzheimer's disease but notes that she has been to the doctor and is taking medication.  He has a history of partial thyroidectomy, as half of his thyroid  was removed previously. His wife requested a thyroid  check during this visit.  ROS A comprehensive ROS was negative for any concerning symptoms.   Completed medication reconciliation: Current Outpatient Medications on File Prior to Visit  Medication Sig   clindamycin  (CLEOCIN -T) 1 % lotion Apply to thigh daily until clear (Patient not taking: Reported on 05/19/2024)   tacrolimus  (PROTOPIC ) 0.1 % ointment APPLY TOPICALLY DAILY. USE ON AFFECTED AREAS. (Patient not taking: Reported on 05/19/2024)   No current facility-administered medications on file prior to visit.  There are no discontinued medications.The following were reviewed and/or entered/updated into our electronic MEDICAL RECORD NUMBERPast Medical History:  Diagnosis Date   Colon polyp    Hx of adenomatous colonic polyps 08/18/2020   Lactose intolerance    Past Surgical History:  Procedure Laterality Date   ANTERIOR CRUCIATE LIGAMENT REPAIR     bilaterally    COLONOSCOPY     COLONOSCOPY W/ POLYPECTOMY  05/2020   ORIF FOREARM FRACTURE Left 2013    THYROIDECTOMY     partial   Social History   Socioeconomic History   Marital status: Married    Spouse name: Not on file   Number of children: 3   Years of education: Not on file   Highest education level: Not on file  Occupational History   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[employer]    Employer: [employer]  Tobacco Use   Smoking status: Some Days    Types: Cigarettes   Smokeless tobacco: Former    Types: Chew   Tobacco comments:    1/6 ppd  Vaping Use   Vaping status: Some Days  Substance and Sexual Activity   Alcohol use: Yes    Alcohol/week: 0.0 standard drinks of alcohol    Comment: 2 drinks daily   Drug use: Yes    Types: Marijuana   Sexual activity: Yes  Other Topics Concern   Not on file  Social History Narrative   Remarried,    2 kids with  second wife    2010   2008   Son from first marriage born 1997      Engineer [employer]    alcoholic drinks : not daily, social   social smoker    2 caffeinated beverages a day no tobacco or drug use now   Social Drivers of Health   Tobacco Use: High Risk (05/19/2024)   Patient History    Smoking Tobacco Use: Some Days  Smokeless Tobacco Use: Former    Passive Exposure: Not on Stage Manager: Not on file  Food Insecurity: Not on file  Transportation Needs: Not on file  Physical Activity: Not on file  Stress: Not on file  Social Connections: Not on file  Intimate Partner Violence: Not on file  Depression (PHQ2-9): Low Risk (05/19/2024)   Depression (PHQ2-9)    PHQ-2 Score: 0  Alcohol Screen: Not on file  Housing: Not on file  Utilities: Not on file  Health Literacy: Not on file      02/22/2018    4:00 PM  Alcohol Use Disorder Test (AUDIT)  1. How often do you have a drink containing alcohol? 3  2. How many drinks containing alcohol do you have on a typical day when you are drinking? 1  3. How often do you have six or more drinks on one occasion? 2  AUDIT-C Score 6  Alcohol Brief Interventions/Follow-up AUDIT  Score <7 follow-up not indicated      Data saved with a previous flowsheet row definition   Family History  Problem Relation Age of Onset   Hypertension Mother    Benign prostatic hyperplasia Father    Colon cancer Maternal Grandmother 62   Sudden death Neg Hx    Hyperlipidemia Neg Hx    Heart attack Neg Hx    Diabetes Neg Hx    Prostate cancer Neg Hx    Allergic rhinitis Neg Hx    Angioedema Neg Hx    Asthma Neg Hx    Eczema Neg Hx    Immunodeficiency Neg Hx    Urticaria Neg Hx    Colon polyps Neg Hx    Esophageal cancer Neg Hx    Rectal cancer Neg Hx    Stomach cancer Neg Hx   Allergies[2] Social History   Substance and Sexual Activity  Sexual Activity Yes  @    05/19/2024    8:45 AM  Depression screen PHQ 2/9  Decreased Interest 0  Down, Depressed, Hopeless 0  PHQ - 2 Score 0      05/18/2023    8:21 AM  Fall Risk   Falls in the past year? 0  Number falls in past yr: 0  Injury with Fall? 0   Risk for fall due to : No Fall Risks  Follow up Falls evaluation completed     Data saved with a previous flowsheet row definition     BP 118/76   Pulse 75   Temp 97.8 F (36.6 C) (Temporal)   Ht 5' 9 (1.753 m)   Wt 198 lb 3.2 oz (89.9 kg)   SpO2 98%   BMI 29.27 kg/m  BP Readings from Last 3 Encounters:  05/19/24 118/76  01/15/24 (!) 154/102  10/16/23 (!) 148/100   Wt Readings from Last 10 Encounters:  05/19/24 198 lb 3.2 oz (89.9 kg)  09/03/23 200 lb (90.7 kg)  08/21/23 200 lb (90.7 kg)  05/18/23 197 lb (89.4 kg)  05/12/22 202 lb 8 oz (91.9 kg)  05/02/21 216 lb (98 kg)  08/18/20 210 lb (95.3 kg)  03/19/20 203 lb 8 oz (92.3 kg)  05/01/19 200 lb (90.7 kg)  03/18/19 200 lb 6 oz (90.9 kg)  Physical Exam  Physical Exam HEENT: Normal oropharynx, normal dentition. CHEST: Clear to auscultation bilaterally with occasional crackles. CARDIOVASCULAR: Strong heart, regular rate and rhythm. ABDOMEN: Normal abdomen. MUSCULOSKELETAL: Normal spine and  extremities. SKIN: Normal skin.  GEN: No acute distress, resting  comfortably. HEENT: Tympanic membranes normal appearing bilaterally, oropharynx clear, no thyromegaly noted, no palpable lymphadenopathy or thyroid  nodules. CARDIOVASCULAR: S1 and S2 heart sounds with regular rate and rhythm, no murmurs appreciated. PULMONARY: Normal work of breathing, clear to auscultation bilaterally, no crackles, wheezes, or rhonchi. ABDOMEN: Soft, nontender, nondistended. MSK: No edema, cyanosis, or clubbing noted. SKIN: Warm, dry, no lesions of concern observed. NEUROLOGICAL: Cranial nerves II-XII grossly intact, strength 5/5 in upper and lower extremities, reflexes symmetric and intact bilaterally. PSYCH: Normal affect and thought content, pleasant and cooperative.  Last CBC Lab Results  Component Value Date   WBC 5.1 05/19/2024   HGB 15.2 05/19/2024   HCT 44.7 05/19/2024   MCV 86.4 05/19/2024   MCH 29.7 07/02/2020   RDW 13.1 05/19/2024   PLT 171.0 05/19/2024   Last metabolic panel Lab Results  Component Value Date   GLUCOSE 95 05/19/2024   NA 140 05/19/2024   K 4.3 05/19/2024   CL 106 05/19/2024   CO2 26 05/19/2024   BUN 10 05/19/2024   CREATININE 1.18 05/19/2024   GFR 72.93 05/19/2024   CALCIUM  9.0 05/19/2024   PROT 6.1 05/19/2024   ALBUMIN 4.4 05/19/2024   BILITOT 0.4 05/19/2024   ALKPHOS 53 05/19/2024   AST 20 05/19/2024   ALT 17 05/19/2024   ANIONGAP 10 07/02/2020   Last lipids Lab Results  Component Value Date   CHOL 144 05/19/2024   HDL 38.40 (L) 05/19/2024   LDLCALC 66 05/19/2024   LDLDIRECT 91.0 05/10/2022   TRIG 199.0 (H) 05/19/2024   CHOLHDL 4 05/19/2024   Last hemoglobin A1c Lab Results  Component Value Date   HGBA1C 5.5 05/18/2023   Last thyroid  functions Lab Results  Component Value Date   TSH 2.39 05/18/2023   Last vitamin D No results found for: 25OHVITD2, 25OHVITD3, VD25OH Last vitamin B12 and Folate No results found for: VITAMINB12,  FOLATE      ======================================  IMPORTANT HEALTH REMINDERS: Report any new or changing skin lesions promptly Maintain recommended screening schedules Discuss any new family history of cancer at future visits Follow up on any new symptoms that persist more than two weeks      Notes:  This document was synthesized by artificial intelligence (Abridge) using HIPAA-compliant recording of the clinical interaction;   We discussed the use of AI scribe software for clinical note transcription with the patient, who gave verbal consent to proceed.    This encounter employed state-of-the-art, real-time, collaborative documentation. The patient was empowered to actively review and assist in updating their electronic medical record on a shared monitor, ensuring transparency and improving accuracy.    Prior to and at the beginning of Comprehensive Physical Exam (CPE) preventive care annual visit appointment types  we clarify to patients Our goal today is to focus on your preventive or annual Comprehensive Physical Exam (CPE) preventive care annual visit, which typically covers routine screenings and overall health maintenance. However, if you share any new or concerning symptoms--such as dizziness, passing out, severe pain, or anything else that may point to a more serious issue--we are both legally and ethically required to evaluate it. We cannot simply overlook or ignore such concerns, even if you later decide you dont want to discuss them, because it could jeopardize your health.  If addressing a new concern takes us  beyond the scope of the preventive visit, we may need to bill separately for that portion of care. We understand financial considerations are important, and were happy to discuss your options  if something new comes up. However, we want to be clear that once you mention a potentially serious issue, we must investigate it; we cant ethically or legally exclude that from  our records or our evaluation. Please let us  know all of your questions or worries. Together, we can decide how best to manage them and how to minimize any unexpected costs, but we want to keep you safe above all else.   This disclosure is mandated by professional ethics and legal obligations, as healthcare providers must address any substantial health concerns raised during any patient interaction and a comprehensive ROS is required by insurance companies for billing preventive-care visit type.   This disclosure ultimately discourages patients financially from reporting significant health issues.   Medical Screening Exam A medical screening exam (MSE) helps to determine whether you need immediate medical treatment relating to any number of symptoms you are having. This type of exam may be done in an emergency department, an urgent care setting, or your health care provider's office. Depending on your symptoms and severity, you may need additional tests or medical therapy. It is important to note that an MSE does not necessarily mean that you will need or receive further medical testing or interventions if your symptoms are not deemed to be medically urgent (emergent). Tell a health care provider about: Any allergies you have. All medicines you are taking, including vitamins, herbs, eye drops, creams, and over-the-counter medicines. Any problems you or family members have had with anesthetic medicines. Any bleeding problems you have. Any surgeries you have had. Any medical conditions you have. Whether you are pregnant or may be pregnant. What happens during the test? During the exam, a health care provider does a short, often focused, physical exam and asks about your medical history to assess: Your current symptoms. Your overall health. Your need for possible further medical intervention. What can I expect after the test? If you have a regular health care provider, make an appointment for a  follow-up visit with him or her. If you do not have a regular health care provider, ask about resources in your community. Your medical screening exam may determine that: You do not need emergency treatment at this time. You need treatment right away. You need to be transferred to another medical center. This may happen if you need an emergent specialist or consultant that is not available at the medical center you are at. You need to have more tests. A medical specialist may be consulted if needed. Get help right away if: Your condition gets worse. You develop new or troubling symptoms before you see your health care provider. These symptoms may represent a serious problem that is an emergency. Do not wait to see if the symptoms will go away. Get medical help right away. Call your local emergency services (911 in the [place] Do not drive yourself to the hospital. Summary A medical screening exam helps to determine whether you need medical treatment right away. This type of exam may be done in an emergency department, an urgent care setting, or your health care provider's office. During the exam, a health care provider does a short physical exam and asks about your current symptoms and overall health. Depending on the exam, more tests or therapies may be ordered. However, an MSE does not necessarily mean that you will have further medical testing if your symptoms are not deemed to be urgent. If you need further care that is not offered at your current medical center,  you may need to be transferred to another facility. This information is not intended to replace advice given to you by your health care provider. Make sure you discuss any questions you have with your health care provider. Document Revised: 01/26/2021 Document Reviewed: 09/23/2020 Elsevier Patient Education  2024 Elsevier Inc.   Health Maintenance, Male Adopting a healthy lifestyle and getting preventive care are important in  promoting health and wellness. Ask your health care provider about: The right schedule for you to have regular tests and exams. Things you can do on your own to prevent diseases and keep yourself healthy. What should I know about diet, weight, and exercise? Eat a healthy diet  Eat a diet that includes plenty of vegetables, fruits, low-fat dairy products, and lean protein. Do not eat a lot of foods that are high in solid fats, added sugars, or sodium. Maintain a healthy weight Body mass index (BMI) is a measurement that can be used to identify possible weight problems. It estimates body fat based on height and weight. Your health care provider can help determine your BMI and help you achieve or maintain a healthy weight. Get regular exercise Get regular exercise. This is one of the most important things you can do for your health. Most adults should: Exercise for at least 150 minutes each week. The exercise should increase your heart rate and make you sweat (moderate-intensity exercise). Do strengthening exercises at least twice a week. This is in addition to the moderate-intensity exercise. Spend less time sitting. Even light physical activity can be beneficial. Watch cholesterol and blood lipids Have your blood tested for lipids and cholesterol at 48 years of age, then have this test every 5 years. You may need to have your cholesterol levels checked more often if: Your lipid or cholesterol levels are high. You are older than 48 years of age. You are at high risk for heart disease. What should I know about cancer screening? Many types of cancers can be detected early and may often be prevented. Depending on your health history and family history, you may need to have cancer screening at various ages. This may include screening for: Colorectal cancer. Prostate cancer. Skin cancer. Lung cancer. What should I know about heart disease, diabetes, and high blood pressure? Blood pressure and  heart disease High blood pressure causes heart disease and increases the risk of stroke. This is more likely to develop in people who have high blood pressure readings or are overweight. Talk with your health care provider about your target blood pressure readings. Have your blood pressure checked: Every 3-5 years if you are 49-6 years of age. Every year if you are 13 years old or older. If you are between the ages of 54 and 78 and are a current or former smoker, ask your health care provider if you should have a one-time screening for abdominal aortic aneurysm (AAA). Diabetes Have regular diabetes screenings. This checks your fasting blood sugar level. Have the screening done: Once every three years after age 16 if you are at a normal weight and have a low risk for diabetes. More often and at a younger age if you are overweight or have a high risk for diabetes. What should I know about preventing infection? Hepatitis B If you have a higher risk for hepatitis B, you should be screened for this virus. Talk with your health care provider to find out if you are at risk for hepatitis B infection. Hepatitis C Blood testing is recommended  for: Everyone born from [birth year] through 1965. Anyone with known risk factors for hepatitis C. Sexually transmitted infections (STIs) You should be screened each year for STIs, including gonorrhea and chlamydia, if: You are sexually active and are younger than 48 years of age. You are older than 48 years of age and your health care provider tells you that you are at risk for this type of infection. Your sexual activity has changed since you were last screened, and you are at increased risk for chlamydia or gonorrhea. Ask your health care provider if you are at risk. Ask your health care provider about whether you are at high risk for HIV. Your health care provider may recommend a prescription medicine to help prevent HIV infection. If you choose to take medicine to  prevent HIV, you should first get tested for HIV. You should then be tested every 3 months for as long as you are taking the medicine. Follow these instructions at home: Alcohol use Do not drink alcohol if your health care provider tells you not to drink. If you drink alcohol: Limit how much you have to 0-2 drinks a day. Know how much alcohol is in your drink. In the [place] one drink equals one 12 oz bottle of beer (355 mL), one 5 oz glass of wine (148 mL), or one 1 oz glass of hard liquor (44 mL). Lifestyle Do not use any products that contain nicotine or tobacco. These products include cigarettes, chewing tobacco, and vaping devices, such as e-cigarettes. If you need help quitting, ask your health care provider. Do not use street drugs. Do not share needles. Ask your health care provider for help if you need support or information about quitting drugs. General instructions Schedule regular health, dental, and eye exams. Stay current with your vaccines. Tell your health care provider if: You often feel depressed. You have ever been abused or do not feel safe at home. Summary Adopting a healthy lifestyle and getting preventive care are important in promoting health and wellness. Follow your health care provider's instructions about healthy diet, exercising, and getting tested or screened for diseases. Follow your health care provider's instructions on monitoring your cholesterol and blood pressure. This information is not intended to replace advice given to you by your health care provider. Make sure you discuss any questions you have with your health care provider. Document Revised: 10/04/2020 Document Reviewed: 10/04/2020 [employer] Patient Education  2024 [employer]     [1]  Social History Tobacco Use  Smoking Status Some Days   Types: Cigarettes  Smokeless Tobacco Former   Types: Chew  Tobacco Comments   1/6 ppd  [2]  Allergies Allergen Reactions   Lactose Intolerance  (Gi) Other (See Comments)   Dairy Aid [Tilactase] Other (See Comments)    Lactose Intolerant, stomachache   "

## 2024-05-19 NOTE — Assessment & Plan Note (Signed)
 Symptoms of bloating after dairy consumption are consistent with lactose intolerance. Continue to avoid dairy products to prevent symptoms.

## 2024-05-19 NOTE — Assessment & Plan Note (Signed)
 Central adiposity is likely related to dietary habits and lifestyle factors such as screen time and junk food consumption. Advised dietary modifications to reduce carbohydrate and junk food intake and encouraged regular physical activity.

## 2024-05-19 NOTE — Assessment & Plan Note (Signed)
 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 Continue monitoring for any changes.

## 2024-05-19 NOTE — Assessment & Plan Note (Signed)
 Cholesterol profile is below average due to elevated triglycerides and borderline low HDL, with normal LDL levels. Provided a handout on packaged foods and trans fats. Recommended a low carb diet to manage triglycerides and a Mediterranean diet to improve overall cholesterol profile.

## 2024-05-19 NOTE — Patient Instructions (Addendum)
 VISIT SUMMARY: You came in today for a routine physical exam. You generally feel healthy but want to improve your sleep habits. You have a history of lactose intolerance and polyps, which were removed eight months ago. You exercise regularly and have recently quit drinking beer but smoke occasionally. You have a family history of prostate cancer and memory loss in your mother. Your wife requested a thyroid  check during this visit.  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 Your cholesterol profile shows elevated triglycerides and borderline low HDL, with normal LDL levels. We provided you with a handout on packaged foods and trans fats. We recommend a low carb diet to manage triglycerides and a Mediterranean diet to improve your overall cholesterol profile.  -CENTRAL ADIPOSITY: This refers to excess fat around your abdomen, which can be related to dietary habits and lifestyle factors. We advised you to reduce your carbohydrate and junk food intake and encouraged you to continue regular physical activity.  -ALLERGIC RHINITIS: This is an allergic reaction that causes symptoms like throat clearing and nasal congestion, likely due to environmental allergens such as pollen. We recommend using an over-the-counter saline nasal spray for sinus care, especially after allergen exposure.  -LACTOSE INTOLERANCE: This means you have difficulty digesting lactose, a sugar found in dairy products, which causes bloating. Continue to avoid dairy products to prevent symptoms.  -HISTORY OF PARTIAL THYROIDECTOMY: You have had half of your thyroid  removed in the past. We ordered thyroid  function tests to monitor your thyroid  status.  -TOBACCO USE: You smoke occasionally, mainly during work breaks. We discussed the potential for lung cancer screening at age 76 if you continue smoking and considered switching to Zyn  pouches as an alternative.  -BENIGN SKIN NEOPLASM (DERMATOFIBROMA): This is a non-cancerous skin growth. It has been previously evaluated by a dermatologist with no signs of malignancy. Continue monitoring for any changes.  -GENERAL HEALTH MAINTENANCE: We discussed the importance of regular screenings and vaccinations. You are not interested in pneumonia, hepatitis B, or flu vaccines at this time. We also discussed the importance of regular eye and dental care. Continue regular eye exams every 1-2 years and dental care every 6 months. Consider using saline sprays for sinus care due to high pollen exposure in [place].  INSTRUCTIONS: Follow up on the results of your thyroid  function tests. Consider lung cancer screening at age 70 if you continue smoking. Continue regular eye exams every 1-2 years and dental care every 6 months.  Building Your Long-Term Health Plan  During today's preventive visit, we covered a variety of important health checks to help you stay on top of your well-being.  We also discussed strategies to maintain your health and identified some areas that might benefit from further exploration.   Preventive care visits like today's are designed to be proactive, but sometimes additional attention may be needed.  Rest assured, we're here for you.  If these areas require further evaluation or management, we'd be happy to schedule a separate, focused appointment to address them in detail.  Addressing Next Steps  [x]   Follow-up Visit: To ensure we address any unresolved issues and continue monitoring your overall health, we recommend scheduling a follow-up appointment in 1 year for your next preventive care visit. If you experience any new problems, need to discuss any medical concerns, or your condition worsens before then, please don't hesitate to call our office to schedule an  appointment or seek emergency care as needed.  [x]   Preventive Measures: Maintaining healthy habits plays a  crucial role in overall wellness. We recommend considering these tips: [x]   Regular appointments with dental and vision professionals [x]   Nightly nasal saline mist to keep sinuses clear [x]   Consistent toothbrushing to maintain oral health [x]   Using an app like SnoreLab to track sleep quality [x]   Routine checks of blood pressure and heart rate [x]   Medical Information: In some instances, we may require additional medical information from other providers to create a comprehensive picture of your health. If applicable, we can provide a medical information release form at the front desk for you to sign, allowing us  to gather these records. [x]   Lab Tests: If any lab tests were ordered today, scheduling them within a week of your visit helps ensure the best possible insurance coverage.  Planning Follow Up to Work on a Problem? Make the Most of Our Focused (20 minute) Appointments  [x]   Clearly state your top concerns at the beginning of the visit to focus our discussion [x]   If you anticipate you will need more time, please inform the front desk during scheduling - we can book multiple appointments in the same week. [x]   If you have transportation problems- use our convenient video appointments or ask about transportation support. [x]   We can get down to business faster if you use MyChart to update information before the visit and submit non-urgent questions before your visit. Thank you for taking the time to provide details through MyChart.  Let our nurse know and she can import this information into your encounter documents.  Arrival and Wait Times  [x]   Arriving on time ensures that everyone receives prompt attention. [x]   Early morning (8a) and afternoon (1p) appointments tend to have shortest wait times. [x]   Unfortunately, we cannot delay appointments for late arrivals or hold slots during phone calls.  Bring to Your Next Appointment:  [x]   Medications: Please bring all your medication  bottles to your next appointment to ensure we have an accurate record of your prescriptions. [x]   Health Diaries: If you're monitoring any health conditions at home, keeping a diary of your readings can be very helpful for discussions at your next appointment.  Reviewing Your Records  [x]   Review your attached preventive care information at the end of these patient instructions. [x]   Review this early draft of your clinical encounter notes below and the final encounter summary tomorrow on MyChart after its been completed.      Getting Answers and Following Up  [x]   Simple Questions & Concerns: For quick questions or basic follow-up after your visit, reach us  at [phone] or MyChart messaging. [x]   Complex Concerns: If your concern is more complex, scheduling an appointment might be best. Discuss this with the staff to find the most suitable option. [x]   Lab & Imaging Results: We'll contact you directly if results are abnormal or you don't use MyChart. Most normal results will be on MyChart within 2-3 business days, with a review message from Dr. Jesus. Haven't heard back in 2 weeks? Need results sooner? Contact us  at [phone]. [x]   Referrals: Our referral coordinator will manage specialist referrals. The specialist's office should contact you within 2 weeks to schedule an appointment. Call us  if you haven't heard from them after 2 weeks.  Staying Connected  [x]   MyChart: Activate your MyChart for the fastest way to access results and message us . See  the last page of this paperwork for instructions on how to activate.  Billing  [x]   X-ray & Lab Orders: These are billed by separate companies. Contact the invoicing company directly for questions or concerns. [x]   Visit Charges: Discuss any billing inquiries with our administrative services team.  Your Satisfaction Matters  [x]   Share Your Experience: We strive for your satisfaction! If you have any complaints, or preferably  compliments, please let Dr. Jesus know directly or contact our Practice Administrators, Manuelita Rubin or [employer], by asking at the front desk.                 Next Steps  [x]   Schedule Follow-Up:  We recommend a follow-up appointment in 1 year for your next wellness visit.  If you develop any new problems, want to address any medical issues, or your condition worsens before then, please call us  for an appointment or seek emergency care. [x]   Preventive Care:  Make sure to keep regular appointments with dental and vision professionals, use nightly nasal saline mist sprays to keep your sinuses clear and toothbrushing to protect your teeth. Use SnoreLab App or other app to track your sleep quality. Check blood pressure and heart rate routinely. [x]   Medical Information Release:  For any relevant medical information we don't have, please sign a release form at the front desk so we can obtain it for your records. [x]   Lab Tests:  Schedule any lab tests from today for within a week to ensure best insurance coverage.    Making the Most of Our Focused (20 minute) Appointments:  [x]   Clearly state your top concerns at the beginning of the visit to focus our discussion [x]   If you anticipate you will need more time, please inform the front desk during scheduling - we can book multiple appointments in the same week. [x]   If you have transportation problems- use our convenient video appointments or ask about transportation support. [x]   We can get down to business faster if you use MyChart to update information before the visit and submit non-urgent questions before your visit. Thank you for taking the time to provide details through MyChart.  Let our nurse know and she can import this information into your encounter documents.  Arrival and Wait Times: [x]   Arriving on time ensures that everyone receives prompt attention. [x]   Early morning (8a) and afternoon (1p) appointments tend to  have shortest wait times. [x]   Unfortunately, we cannot delay appointments for late arrivals or hold slots during phone calls.  Bring to Your Next Appointment  [x]   Medications: Please bring all your medication bottles to your next appointment to ensure we have an accurate record of your prescriptions. [x]   Health Diaries: If you're monitoring any health conditions at home, keeping a diary of your readings can be very helpful for discussions at your next appointment.  Reviewing Your Records  [x]   Review your attached preventive care information at the end of these patient instructions. [x]   Review this early draft of your clinical encounter notes below and the final encounter summary tomorrow on MyChart after its been completed.   Encounter for annual general medical examination with abnormal findings in adult -     PSA -     Lipid panel -     Comprehensive metabolic panel with GFR -     CBC with Differential/Platelet -     TSH Rfx on Abnormal to Free T4  FH: prostate cancer -  PSA  H/O partial thyroidectomy -     TSH Rfx on Abnormal to Free T4  Encounter for annual health examination  Screening for malignant neoplasm of colon  Screening for malignant neoplasm of prostate  Pure hypertriglyceridemia -     Lipid panel  Central adiposity  Allergic rhinitis due to other allergic trigger, unspecified seasonality  Lactose intolerance  Tobacco use  Skin lesion     Getting Answers and Following Up  [x]   Simple Questions & Concerns: For quick questions or basic follow-up after your visit, reach us  at [phone] or MyChart messaging. [x]   Complex Concerns: If your concern is more complex, scheduling an appointment might be best. Discuss this with the staff to find the most suitable option. [x]   Lab & Imaging Results: We'll contact you directly if results are abnormal or you don't use MyChart. Most normal results will be on MyChart within 2-3 business days, with a review  message from Dr. Jesus. Haven't heard back in 2 weeks? Need results sooner? Contact us  at [phone]. [x]   Referrals: Our referral coordinator will manage specialist referrals. The specialist's office should contact you within 2 weeks to schedule an appointment. Call us  if you haven't heard from them after 2 weeks.  Staying Connected  [x]   MyChart: Activate your MyChart for the fastest way to access results and message us . See the last page of this paperwork for instructions on how to activate.  Billing  [x]   X-ray & Lab Orders: These are billed by separate companies. Contact the invoicing company directly for questions or concerns. [x]   Visit Charges: Discuss any billing inquiries with our administrative services team.  Your Satisfaction Matters  [x]   Share Your Experience: We strive for your satisfaction! If you have any complaints, or preferably compliments, please let Dr. Jesus know directly or contact our Practice Administrators, Manuelita Rubin or [employer], by asking at the front desk.    Medical Screening Exam A medical screening exam (MSE) helps to determine whether you need immediate medical treatment relating to any number of symptoms you are having. This type of exam may be done in an emergency department, an urgent care setting, or your health care provider's office. Depending on your symptoms and severity, you may need additional tests or medical therapy. It is important to note that an MSE does not necessarily mean that you will need or receive further medical testing or interventions if your symptoms are not deemed to be medically urgent (emergent). Tell a health care provider about: Any allergies you have. All medicines you are taking, including vitamins, herbs, eye drops, creams, and over-the-counter medicines. Any problems you or family members have had with anesthetic medicines. Any bleeding problems you have. Any surgeries you have had. Any medical  conditions you have. Whether you are pregnant or may be pregnant. What happens during the test? During the exam, a health care provider does a short, often focused, physical exam and asks about your medical history to assess: Your current symptoms. Your overall health. Your need for possible further medical intervention. What can I expect after the test? If you have a regular health care provider, make an appointment for a follow-up visit with him or her. If you do not have a regular health care provider, ask about resources in your community. Your medical screening exam may determine that: You do not need emergency treatment at this time. You need treatment right away. You need to be transferred to another medical center. This  may happen if you need an emergent specialist or consultant that is not available at the medical center you are at. You need to have more tests. A medical specialist may be consulted if needed. Get help right away if: Your condition gets worse. You develop new or troubling symptoms before you see your health care provider. These symptoms may represent a serious problem that is an emergency. Do not wait to see if the symptoms will go away. Get medical help right away. Call your local emergency services (911 in the [place] Do not drive yourself to the hospital. Summary A medical screening exam helps to determine whether you need medical treatment right away. This type of exam may be done in an emergency department, an urgent care setting, or your health care provider's office. During the exam, a health care provider does a short physical exam and asks about your current symptoms and overall health. Depending on the exam, more tests or therapies may be ordered. However, an MSE does not necessarily mean that you will have further medical testing if your symptoms are not deemed to be urgent. If you need further care that is not offered at your current medical center, you may  need to be transferred to another facility. This information is not intended to replace advice given to you by your health care provider. Make sure you discuss any questions you have with your health care provider. Document Revised: 01/26/2021 Document Reviewed: 09/23/2020 Elsevier Patient Education  2024 [employer].     Building Your Long-Term Health Plan  During today's preventive visit, we covered a variety of important health checks to help you stay on top of your well-being.  We also discussed strategies to maintain your health and identified some areas that might benefit from further exploration.   Preventive care visits like today's are designed to be proactive, but sometimes additional attention may be needed.  Rest assured, we're here for you.  If these areas require further evaluation or management, we'd be happy to schedule a separate, focused appointment to address them in detail.  Addressing Next Steps  [x]   Follow-up Visit: To ensure we address any unresolved issues and continue monitoring your overall health, we recommend scheduling a follow-up appointment in 1 year for your next preventive care visit. If you experience any new problems, need to discuss any medical concerns, or your condition worsens before then, please don't hesitate to call our office to schedule an appointment or seek emergency care as needed.  [x]   Preventive Measures: Maintaining healthy habits plays a crucial role in overall wellness. We recommend considering these tips: [x]   Regular appointments with dental and vision professionals [x]   Nightly nasal saline mist to keep sinuses clear [x]   Consistent toothbrushing to maintain oral health [x]   Using an app like [employer]oreLab to track sleep quality [x]   Routine checks of blood pressure and heart rate [x]   Medical Information: In some instances, we may require additional medical information from other providers to create a comprehensive picture of your health. If  applicable, we can provide a medical information release form at the front desk for you to sign, allowing us  to gather these records. [x]   Lab Tests: If any lab tests were ordered today, scheduling them within a week of your visit helps ensure the best possible insurance coverage.  Planning Follow Up to Work on a Problem? Make the Most of Our Focused (20 minute) Appointments  [x]   Clearly state your top concerns at the beginning  of the visit to focus our discussion [x]   If you anticipate you will need more time, please inform the front desk during scheduling - we can book multiple appointments in the same week. [x]   If you have transportation problems- use our convenient video appointments or ask about transportation support. [x]   We can get down to business faster if you use MyChart to update information before the visit and submit non-urgent questions before your visit. Thank you for taking the time to provide details through MyChart.  Let our nurse know and she can import this information into your encounter documents.  Arrival and Wait Times  [x]   Arriving on time ensures that everyone receives prompt attention. [x]   Early morning (8a) and afternoon (1p) appointments tend to have shortest wait times. [x]   Unfortunately, we cannot delay appointments for late arrivals or hold slots during phone calls.  Bring to Your Next Appointment:  [x]   Medications: Please bring all your medication bottles to your next appointment to ensure we have an accurate record of your prescriptions. [x]   Health Diaries: If you're monitoring any health conditions at home, keeping a diary of your readings can be very helpful for discussions at your next appointment.  Reviewing Your Records  [x]   Review your attached preventive care information at the end of these patient instructions. [x]   Review this early draft of your clinical encounter notes below and the final encounter summary tomorrow on MyChart after its  been completed.      Getting Answers and Following Up  [x]   Simple Questions & Concerns: For quick questions or basic follow-up after your visit, reach us  at [phone] or MyChart messaging. [x]   Complex Concerns: If your concern is more complex, scheduling an appointment might be best. Discuss this with the staff to find the most suitable option. [x]   Lab & Imaging Results: We'll contact you directly if results are abnormal or you don't use MyChart. Most normal results will be on MyChart within 2-3 business days, with a review message from Dr. Jesus. Haven't heard back in 2 weeks? Need results sooner? Contact us  at [phone]. [x]   Referrals: Our referral coordinator will manage specialist referrals. The specialist's office should contact you within 2 weeks to schedule an appointment. Call us  if you haven't heard from them after 2 weeks.  Staying Connected  [x]   MyChart: Activate your MyChart for the fastest way to access results and message us . See the last page of this paperwork for instructions on how to activate.  Billing  [x]   X-ray & Lab Orders: These are billed by separate companies. Contact the invoicing company directly for questions or concerns. [x]   Visit Charges: Discuss any billing inquiries with our administrative services team.  Your Satisfaction Matters  [x]   Share Your Experience: We strive for your satisfaction! If you have any complaints, or preferably compliments, please let Dr. Jesus know directly or contact our Practice Administrators, Manuelita Rubin or [employer], by asking at the front desk.                 Next Steps  [x]   Schedule Follow-Up:  We recommend a follow-up appointment in 1 year for your next wellness visit.  If you develop any new problems, want to address any medical issues, or your condition worsens before then, please call us  for an appointment or seek emergency care. [x]   Preventive Care:  Make sure to keep regular  appointments with dental and vision professionals, use nightly  nasal saline mist sprays to keep your sinuses clear and toothbrushing to protect your teeth. Use SnoreLab App or other app to track your sleep quality. Check blood pressure and heart rate routinely. [x]   Medical Information Release:  For any relevant medical information we don't have, please sign a release form at the front desk so we can obtain it for your records. [x]   Lab Tests:  Schedule any lab tests from today for within a week to ensure best insurance coverage.    Making the Most of Our Focused (20 minute) Appointments:  [x]   Clearly state your top concerns at the beginning of the visit to focus our discussion [x]   If you anticipate you will need more time, please inform the front desk during scheduling - we can book multiple appointments in the same week. [x]   If you have transportation problems- use our convenient video appointments or ask about transportation support. [x]   We can get down to business faster if you use MyChart to update information before the visit and submit non-urgent questions before your visit. Thank you for taking the time to provide details through MyChart.  Let our nurse know and she can import this information into your encounter documents.  Arrival and Wait Times: [x]   Arriving on time ensures that everyone receives prompt attention. [x]   Early morning (8a) and afternoon (1p) appointments tend to have shortest wait times. [x]   Unfortunately, we cannot delay appointments for late arrivals or hold slots during phone calls.  Bring to Your Next Appointment  [x]   Medications: Please bring all your medication bottles to your next appointment to ensure we have an accurate record of your prescriptions. [x]   Health Diaries: If you're monitoring any health conditions at home, keeping a diary of your readings can be very helpful for discussions at your next appointment.  Reviewing Your Records  [x]   Review  your attached preventive care information at the end of these patient instructions. [x]   Review this early draft of your clinical encounter notes below and the final encounter summary tomorrow on MyChart after its been completed.   There are no diagnoses linked to this encounter.   Getting Answers and Following Up  [x]   Simple Questions & Concerns: For quick questions or basic follow-up after your visit, reach us  at [phone] or MyChart messaging. [x]   Complex Concerns: If your concern is more complex, scheduling an appointment might be best. Discuss this with the staff to find the most suitable option. [x]   Lab & Imaging Results: We'll contact you directly if results are abnormal or you don't use MyChart. Most normal results will be on MyChart within 2-3 business days, with a review message from Dr. Jesus. Haven't heard back in 2 weeks? Need results sooner? Contact us  at [phone]. [x]   Referrals: Our referral coordinator will manage specialist referrals. The specialist's office should contact you within 2 weeks to schedule an appointment. Call us  if you haven't heard from them after 2 weeks.  Staying Connected  [x]   MyChart: Activate your MyChart for the fastest way to access results and message us . See the last page of this paperwork for instructions on how to activate.  Billing  [x]   X-ray & Lab Orders: These are billed by separate companies. Contact the invoicing company directly for questions or concerns. [x]   Visit Charges: Discuss any billing inquiries with our administrative services team.  Your Satisfaction Matters  [x]   Share Your Experience: We strive for your satisfaction! If you  have any complaints, or preferably compliments, please let Dr. Jesus know directly or contact our Practice Administrators, Manuelita Rubin or [employer], by asking at the front desk.    Medical Screening Exam A medical screening exam (MSE) helps to determine whether you need immediate  medical treatment relating to any number of symptoms you are having. This type of exam may be done in an emergency department, an urgent care setting, or your health care provider's office. Depending on your symptoms and severity, you may need additional tests or medical therapy. It is important to note that an MSE does not necessarily mean that you will need or receive further medical testing or interventions if your symptoms are not deemed to be medically urgent (emergent). Tell a health care provider about: Any allergies you have. All medicines you are taking, including vitamins, herbs, eye drops, creams, and over-the-counter medicines. Any problems you or family members have had with anesthetic medicines. Any bleeding problems you have. Any surgeries you have had. Any medical conditions you have. Whether you are pregnant or may be pregnant. What happens during the test? During the exam, a health care provider does a short, often focused, physical exam and asks about your medical history to assess: Your current symptoms. Your overall health. Your need for possible further medical intervention. What can I expect after the test? If you have a regular health care provider, make an appointment for a follow-up visit with him or her. If you do not have a regular health care provider, ask about resources in your community. Your medical screening exam may determine that: You do not need emergency treatment at this time. You need treatment right away. You need to be transferred to another medical center. This may happen if you need an emergent specialist or consultant that is not available at the medical center you are at. You need to have more tests. A medical specialist may be consulted if needed. Get help right away if: Your condition gets worse. You develop new or troubling symptoms before you see your health care provider. These symptoms may represent a serious problem that is an emergency.  Do not wait to see if the symptoms will go away. Get medical help right away. Call your local emergency services (911 in the [place] Do not drive yourself to the hospital. Summary A medical screening exam helps to determine whether you need medical treatment right away. This type of exam may be done in an emergency department, an urgent care setting, or your health care provider's office. During the exam, a health care provider does a short physical exam and asks about your current symptoms and overall health. Depending on the exam, more tests or therapies may be ordered. However, an MSE does not necessarily mean that you will have further medical testing if your symptoms are not deemed to be urgent. If you need further care that is not offered at your current medical center, you may need to be transferred to another facility. This information is not intended to replace advice given to you by your health care provider. Make sure you discuss any questions you have with your health care provider. Document Revised: 01/26/2021 Document Reviewed: 09/23/2020 Elsevier Patient Education  2024 [employer]       ?? Trans Fats: What You Need to Know (and How to Avoid Them) Protect Your Heart, Brain, and Overall Health  ? What Are Trans Fats? Trans fats are a type of unhealthy fat that can increase your risk  of: Heart disease Stroke Type 2 diabetes Inflammation Memory problems They are artificially made through a process called hydrogenation and were once common in processed foods for better shelf life and texture.  ?? Why Should I Avoid Trans Fats? Even small amounts of trans fats can: Raise bad LDL cholesterol Lower good HDL cholesterol Cause inflammation in your blood vessels Increase your risk of heart attack or stroke There is no safe level of artificial trans fat.  ?? How to Spot Trans Fats (Even When the Label Says 0g) Food companies can legally say 0 grams trans fat if the  product contains less than 0.5 grams per serving -- but that can add up fast! Look at the ingredients list for these clues: ?? Partially hydrogenated oil ? this means trans fat is present. ? Avoid foods with shortening or hydrogenated oils.  ?? Common Foods That May Contain Trans Fats Even today, you may find trans fats in: Baked goods (cookies, cakes, pies) Microwave popcorn Crackers Margarine and shortening Fried fast foods Frozen pizza  ? Healthier Choices Choose products with 0g trans fat and no partially hydrogenated oil in the ingredients. Use olive oil, avocado oil, or canola oil for cooking. Eat more whole, unprocessed foods: fruits, vegetables, whole grains, and lean proteins. Choose baked over fried, and fresh over packaged.  ?? Takeaway Message Trans fats are harmful, even in small amounts. To protect your health: Read labels carefully. Look beyond 0g trans fat and scan for partially hydrogenated oils. Choose whole foods and heart-healthy fats.    ALLERGY MANAGEMENT PLAN  This plan is designed to help manage your allergic rhinitis (nasal allergies) effectively. Follow these steps daily for best results.  Sinus saline sprays- use nightly, and after sneezing episodes or exposure to allergen.  Insert deeply and spray mist into nose while leaning over sink at 45 degrees,  while gently breathing. Also blow out onto tissue while leaning forward 45 degrees. Once daily, after a sinus rinse, use sensimist.  Just before bedtime is best. This only needed if allergies acting up.  If this is inadequate add-on once daily for levocetirizine / xyzal 5 mg for nondrowsy antihistamine Take benadryl 25 mg at bedtime also if allergic mucus is persisting  When allergies cause chronic swelling in sinuses, it leads to sinus infections:    DAILY TREATMENT ROUTINE   Time of Day Treatment Steps  Morning 1. Saline Nasal Spray - Use to cleanse nasal passages 2. Xyzal  (levocetirizine) - Take one tablet daily   Throughout Day Saline Nasal Spray - Use 2 additional times (mid-day and afternoon)   Evening/Bedtime 1. Saline Nasal Rinse - Thoroughly clean nasal passages 2. Flonase Sensimist - Apply after nasal rinse 3. Benadryl (diphenhydramine) - Take 25mg  if experiencing persistent congestion    PROPER TECHNIQUE GUIDE       Saline Nasal Spray/Rinse Technique: Lean forward over sink at a 45-degree angle Turn head slightly to one side Insert spray tip into upper nostril Spray gently while breathing lightly through your nose Repeat on other side Gently blow nose to clear excess solution Use saline spray 3 times daily to keep nasal passages moist and clear allergens.       Flonase Sensimist Technique: Shake bottle gently before each use Prime the bottle if it's new or hasn't been used for a week Tilt your head forward slightly Insert tip into nostril, pointing away from the center of your nose Spray while inhaling gently Repeat in other nostril Use Flonase Sensimist once  daily, preferably at bedtime after using saline rinse. It may take several days of regular use to feel maximum benefit.   WHY FLONASE SENSIMIST?   Benefits of Flonase Sensimist:  Alcohol-free and scent-free formula - gentler on sensitive nasal passages Fine mist application - more comfortable with less dripping down throat Effectively relieves nasal congestion, sneezing, runny nose, and even eye symptoms 24-hour relief with once-daily dosing Uses a more potent form of fluticasone that works at a lower dose Less liquid per spray means less discomfort  UNDERSTANDING YOUR MEDICATIONS   Medication How It Works Important Notes  Flonase Sensimist (fluticasone furoate) Reduces inflammation in nasal passages, addressing the underlying cause of allergy symptoms - Takes several days for full effect - Use daily for best results - Safe for long-term use   Xyzal (levocetirizine) Blocks  histamine to reduce allergy symptoms like sneezing and itching - Take at the same time each day - May cause drowsiness in some people - Once-daily dosing   Benadryl (diphenhydramine) Antihistamine that provides additional relief for breakthrough symptoms - Causes drowsiness - Use only at bedtime - For occasional use when needed   Saline Spray/Rinse Physically removes allergens and moistens nasal passages - Safe to use frequently - Improves effectiveness of other treatments - Reduces nasal irritation    CONTACT YOUR PROVIDER IF: Your symptoms do not improve after 1-2 weeks of following this plan You develop sinus pain with fever or green/yellow discharge You experience frequent nosebleeds You develop new or worsening symptoms You have questions about your treatment plan     ADDITIONAL ALLERGY MANAGEMENT TIPS   HELPFUL STRATEGIES: ?? Keep windows closed during high pollen seasons ??? Use allergen-proof covers for pillows and mattresses ?? Vacuum regularly with a HEPA filter vacuum ?? Shower and change clothes after spending time outdoors ?? Check local pollen counts and limit outdoor time when counts are high ?? Stay well-hydrated to help keep mucous membranes moist

## 2024-05-21 LAB — TSH RFX ON ABNORMAL TO FREE T4: TSH: 4.5 u[IU]/mL (ref 0.450–4.500)

## 2024-05-22 ENCOUNTER — Ambulatory Visit: Payer: Self-pay | Admitting: Internal Medicine

## 2024-05-23 NOTE — Telephone Encounter (Signed)
 Tried to call pt about labs no answer left message to call office back or review them via my chart providers has results in there

## 2024-06-03 ENCOUNTER — Encounter: Payer: Self-pay | Admitting: Dermatology

## 2024-06-03 ENCOUNTER — Ambulatory Visit: Admitting: Dermatology

## 2024-06-03 VITALS — BP 124/84

## 2024-06-03 DIAGNOSIS — B079 Viral wart, unspecified: Secondary | ICD-10-CM | POA: Diagnosis not present

## 2024-06-03 DIAGNOSIS — L281 Prurigo nodularis: Secondary | ICD-10-CM

## 2024-06-03 DIAGNOSIS — L910 Hypertrophic scar: Secondary | ICD-10-CM

## 2024-06-03 DIAGNOSIS — L720 Epidermal cyst: Secondary | ICD-10-CM

## 2024-06-03 NOTE — Progress Notes (Signed)
" ° °  Follow-Up Visit   Subjective  Martin Pruitt is a 49 y.o. male established patient who presents for FOLLOW UP on the diagnoses listed below:  Patient was last evaluated on 01/15/24.   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 Pt stated that he D/C tacrolimus  shortly after last OV as he did not feel it was working. He would like to discuss other topical options to help. Pt stated that his itch is constantly a 3/10. However, pt stated thigh has resolved.   The following portions of the chart were reviewed this encounter and updated as appropriate: medications, allergies, medical history  Review of Systems:  No other skin or systemic complaints except as noted in HPI or Assessment and Plan.  Objective  Well appearing patient in no apparent distress; mood and affect are within normal limits.   A focused examination was performed of the following areas: chest, L arm   Relevant exam findings are noted in the Assessment and Plan.      Chest - Medial (Center) 6mm firm pink nodule  Assessment & Plan   PRURIGO NODULARIS/LICHEN SIMPLEX CHRONICUS Exam: Excoriated papules and nodules at posterior L arm & central chest  improved  Treatment Plan: - No improvement with topical regimen or ILK since pt continues to pick -Discussed Nemluvio however, pt only has one lesions -Opted for shave removal since the other lesions that was bx'd for confirming the diagnosis never returned.   -Continue using topical clobetasol BID 2 weeks on / 2 weeks off, for future lesions  KELOID Chest - Medial (Center) - Epidermal / dermal shaving - Chest - Medial (Center)  Lesion diameter (cm):  0.6 Informed consent: discussed and consent obtained   Timeout: patient name, date of birth, surgical site, and procedure verified   Procedure prep:  Patient was prepped and draped in usual sterile fashion Prep type:  Isopropyl alcohol Anesthesia: the lesion  was anesthetized in a standard fashion   Anesthetic:  1% lidocaine  w/ epinephrine 1-100,000 buffered w/ 8.4% NaHCO3 Instrument used: flexible razor blade   Hemostasis achieved with: pressure, aluminum chloride and electrodesiccation   Outcome: patient tolerated procedure well   Post-procedure details: sterile dressing applied and wound care instructions given   Dressing type: bandage and petrolatum     No follow-ups on file.   Documentation: I have reviewed the above documentation for accuracy and completeness, and I agree with the above.  I, Shirron Maranda, CMA II, am acting as scribe for:  Delon Alm, DO "

## 2024-06-03 NOTE — Patient Instructions (Addendum)

## 2024-06-04 LAB — SURGICAL PATHOLOGY

## 2024-06-10 ENCOUNTER — Ambulatory Visit: Payer: Self-pay | Admitting: Dermatology

## 2025-05-20 ENCOUNTER — Encounter: Admitting: Internal Medicine
